# Patient Record
Sex: Female | Born: 1970
Health system: Southern US, Community
[De-identification: ages and names within clinical notes are randomized; demographics above are authoritative.]

## PROBLEM LIST (undated history)

## (undated) DIAGNOSIS — D242 Benign neoplasm of left breast: Secondary | ICD-10-CM

## (undated) DIAGNOSIS — N941 Unspecified dyspareunia: Secondary | ICD-10-CM

## (undated) DIAGNOSIS — Z973 Presence of spectacles and contact lenses: Secondary | ICD-10-CM

## (undated) DIAGNOSIS — D509 Iron deficiency anemia, unspecified: Secondary | ICD-10-CM

## (undated) DIAGNOSIS — D649 Anemia, unspecified: Secondary | ICD-10-CM

## (undated) DIAGNOSIS — D259 Leiomyoma of uterus, unspecified: Secondary | ICD-10-CM

## (undated) DIAGNOSIS — Z972 Presence of dental prosthetic device (complete) (partial): Secondary | ICD-10-CM

## (undated) DIAGNOSIS — H1849 Other corneal degeneration: Secondary | ICD-10-CM

## (undated) DIAGNOSIS — N950 Postmenopausal bleeding: Secondary | ICD-10-CM

## (undated) HISTORY — PX: CORNEAL TRANSPLANT: SHX108

---

## 1993-11-24 HISTORY — PX: CORNEAL TRANSPLANT: SHX108

## 1996-11-24 HISTORY — PX: APPENDECTOMY: SHX54

## 1998-11-24 HISTORY — PX: PENETRATING KERATOPLASTY: SHX2197

## 2000-10-19 ENCOUNTER — Other Ambulatory Visit: Admission: RE | Admit: 2000-10-19 | Discharge: 2000-10-19 | Payer: Self-pay | Admitting: Obstetrics & Gynecology

## 2001-12-16 ENCOUNTER — Other Ambulatory Visit: Admission: RE | Admit: 2001-12-16 | Discharge: 2001-12-16 | Payer: Self-pay | Admitting: Obstetrics & Gynecology

## 2002-08-29 ENCOUNTER — Inpatient Hospital Stay (HOSPITAL_COMMUNITY): Admission: AD | Admit: 2002-08-29 | Discharge: 2002-08-29 | Payer: Self-pay | Admitting: Obstetrics and Gynecology

## 2002-10-19 ENCOUNTER — Inpatient Hospital Stay (HOSPITAL_COMMUNITY): Admission: AD | Admit: 2002-10-19 | Discharge: 2002-10-22 | Payer: Self-pay | Admitting: Obstetrics & Gynecology

## 2002-11-29 ENCOUNTER — Other Ambulatory Visit: Admission: RE | Admit: 2002-11-29 | Discharge: 2002-11-29 | Payer: Self-pay | Admitting: Obstetrics & Gynecology

## 2004-06-17 ENCOUNTER — Emergency Department (HOSPITAL_COMMUNITY): Admission: EM | Admit: 2004-06-17 | Discharge: 2004-06-17 | Payer: Self-pay | Admitting: Emergency Medicine

## 2005-05-29 ENCOUNTER — Ambulatory Visit (HOSPITAL_COMMUNITY): Admission: RE | Admit: 2005-05-29 | Discharge: 2005-05-29 | Payer: Self-pay | Admitting: Gastroenterology

## 2006-03-25 ENCOUNTER — Other Ambulatory Visit: Admission: RE | Admit: 2006-03-25 | Discharge: 2006-03-25 | Payer: Self-pay | Admitting: *Deleted

## 2006-12-27 ENCOUNTER — Emergency Department (HOSPITAL_COMMUNITY): Admission: EM | Admit: 2006-12-27 | Discharge: 2006-12-27 | Payer: Self-pay | Admitting: Emergency Medicine

## 2007-03-31 ENCOUNTER — Other Ambulatory Visit: Admission: RE | Admit: 2007-03-31 | Discharge: 2007-03-31 | Payer: Self-pay | Admitting: *Deleted

## 2007-08-24 ENCOUNTER — Ambulatory Visit: Payer: Self-pay | Admitting: Internal Medicine

## 2007-08-25 DIAGNOSIS — D573 Sickle-cell trait: Secondary | ICD-10-CM

## 2007-08-25 HISTORY — DX: Sickle-cell trait: D57.3

## 2007-08-26 LAB — CBC WITH DIFFERENTIAL/PLATELET
Eosinophils Absolute: 0.2 10*3/uL (ref 0.0–0.5)
NEUT%: 53.7 % (ref 39.6–76.8)
Platelets: 259 10*3/uL (ref 145–400)
RBC: 3.67 10*6/uL — ABNORMAL LOW (ref 3.70–5.32)
RDW: 16.8 % — ABNORMAL HIGH (ref 11.3–14.5)
WBC: 4.2 10*3/uL (ref 3.9–10.0)
lymph#: 1.4 10*3/uL (ref 0.9–3.3)

## 2007-08-27 LAB — COMPREHENSIVE METABOLIC PANEL
BUN: 10 mg/dL (ref 6–23)
CO2: 26 mEq/L (ref 19–32)
Calcium: 9 mg/dL (ref 8.4–10.5)
Chloride: 107 mEq/L (ref 96–112)
Glucose, Bld: 77 mg/dL (ref 70–99)
Sodium: 140 mEq/L (ref 135–145)
Total Bilirubin: 0.6 mg/dL (ref 0.3–1.2)

## 2007-08-27 LAB — IRON AND TIBC
Iron: 60 ug/dL (ref 42–145)
UIBC: 249 ug/dL

## 2007-08-27 LAB — FERRITIN: Ferritin: 13 ng/mL (ref 10–291)

## 2007-08-27 LAB — FOLATE RBC: RBC Folate: 433 ng/mL (ref 180–600)

## 2007-08-30 LAB — HEMOGLOBINOPATHY EVALUATION
Hemoglobin Other: 0 % (ref 0.0–0.0)
Hgb A2 Quant: 3.3 % — ABNORMAL HIGH (ref 2.2–3.2)
Hgb A: 54.8 % — ABNORMAL LOW (ref 96.8–97.8)
Hgb F Quant: 0.8 % (ref 0.0–2.0)
Hgb S Quant: 41.1 % — ABNORMAL HIGH (ref 0.0–0.0)

## 2007-10-14 ENCOUNTER — Ambulatory Visit: Payer: Self-pay | Admitting: Internal Medicine

## 2007-10-18 LAB — IRON AND TIBC
TIBC: 307 ug/dL (ref 250–470)
UIBC: 179 ug/dL

## 2007-10-18 LAB — CBC WITH DIFFERENTIAL/PLATELET
Basophils Absolute: 0 10*3/uL (ref 0.0–0.1)
EOS%: 3.4 % (ref 0.0–7.0)
Eosinophils Absolute: 0.2 10*3/uL (ref 0.0–0.5)
HCT: 31.2 % — ABNORMAL LOW (ref 34.8–46.6)
HGB: 10.7 g/dL — ABNORMAL LOW (ref 11.6–15.9)
LYMPH%: 32.8 % (ref 14.0–48.0)
MONO%: 9.4 % (ref 0.0–13.0)
NEUT%: 53.7 % (ref 39.6–76.8)
Platelets: 289 10*3/uL (ref 145–400)

## 2007-10-18 LAB — FERRITIN: Ferritin: 19 ng/mL (ref 10–291)

## 2008-01-13 ENCOUNTER — Ambulatory Visit: Payer: Self-pay | Admitting: Internal Medicine

## 2008-01-18 LAB — CBC WITH DIFFERENTIAL/PLATELET
BASO%: 0.4 % (ref 0.0–2.0)
Basophils Absolute: 0 10*3/uL (ref 0.0–0.1)
EOS%: 5.7 % (ref 0.0–7.0)
Eosinophils Absolute: 0.3 10*3/uL (ref 0.0–0.5)
HGB: 11 g/dL — ABNORMAL LOW (ref 11.6–15.9)
MCV: 84.3 fL (ref 81.0–101.0)
MONO#: 0.5 10*3/uL (ref 0.1–0.9)
MONO%: 9 % (ref 0.0–13.0)
RBC: 3.86 10*6/uL (ref 3.70–5.32)

## 2008-01-18 LAB — IRON AND TIBC
Iron: 35 ug/dL — ABNORMAL LOW (ref 42–145)
UIBC: 284 ug/dL

## 2008-04-20 ENCOUNTER — Ambulatory Visit: Payer: Self-pay | Admitting: Internal Medicine

## 2008-04-25 LAB — CBC WITH DIFFERENTIAL/PLATELET
EOS%: 3 % (ref 0.0–7.0)
Eosinophils Absolute: 0.2 10*3/uL (ref 0.0–0.5)
HCT: 31.6 % — ABNORMAL LOW (ref 34.8–46.6)
HGB: 10.6 g/dL — ABNORMAL LOW (ref 11.6–15.9)
LYMPH%: 33.4 % (ref 14.0–48.0)
MCHC: 33.6 g/dL (ref 32.0–36.0)
MCV: 84.3 fL (ref 81.0–101.0)
MONO%: 7.6 % (ref 0.0–13.0)
NEUT#: 2.9 10*3/uL (ref 1.5–6.5)
NEUT%: 55.3 % (ref 39.6–76.8)
Platelets: 256 10*3/uL (ref 145–400)
WBC: 5.2 10*3/uL (ref 3.9–10.0)

## 2008-04-25 LAB — FERRITIN: Ferritin: 19 ng/mL (ref 10–291)

## 2008-04-25 LAB — IRON AND TIBC: TIBC: 313 ug/dL (ref 250–470)

## 2009-02-13 ENCOUNTER — Other Ambulatory Visit: Admission: RE | Admit: 2009-02-13 | Discharge: 2009-02-13 | Payer: Self-pay | Admitting: Family Medicine

## 2010-02-14 ENCOUNTER — Other Ambulatory Visit: Admission: RE | Admit: 2010-02-14 | Discharge: 2010-02-14 | Payer: Self-pay | Admitting: Family Medicine

## 2010-12-10 ENCOUNTER — Encounter
Admission: RE | Admit: 2010-12-10 | Discharge: 2010-12-10 | Payer: Self-pay | Source: Home / Self Care | Attending: Family Medicine | Admitting: Family Medicine

## 2010-12-17 ENCOUNTER — Encounter
Admission: RE | Admit: 2010-12-17 | Discharge: 2010-12-17 | Payer: Self-pay | Source: Home / Self Care | Attending: Family Medicine | Admitting: Family Medicine

## 2011-04-11 NOTE — Discharge Summary (Signed)
   NAME:  Angie Carroll, Angie Carroll                         ACCOUNT NO.:  [id number]   MEDICAL RECORD NO.:  [id number]                   PATIENT TYPE:  INP   LOCATION:  9135                                 FACILITY:  [facility]   PHYSICIAN:  Leilani Able, P.A.-C.          DATE OF BIRTH:  04/21/1971   DATE OF ADMISSION:  10/19/2002  DATE OF DISCHARGE:  10/22/2002                                 DISCHARGE SUMMARY   FINAL DIAGNOSES:  1. Intrauterine pregnancy at 38 plus weeks gestation, early active  labor,     history of previous cesarean section, desire for repeat cesarean section.  2. Pedunculated myoma.   PROCEDURE:  Repeat low transverse cesarean section, by Gerrit Friends. Aldona Bar, M.D.  No complications.   HISTORY OF PRESENT ILLNESS:  This 40 year old G3, P1, presents at 54 plus  weeks gestation in active labor. The patient had a positive group B  Streptococcus cultures which was performed in the office and she had a  questionable rupture of membranes for two hours before presentation. The  patient's antepartum course had been complicated by positive group B  Streptococcus culture, a sickle cell trait although the father of the baby  was negative, history of a previous cesarean section and history of an  ectopic pregnancy.   Upon admission the patient expressed her desire for a repeat cesarean  section. She was taken to the operating room on October 19, 2002, by Dr.  Annamaria Helling, where repeat low transverse cesarean section as performed with  the delivery of a 7 pound, 5 ounce female infant with Apgars of 8 and 9.  There was also a pedunculated fundal myoma that was found in the left  fundus. This was left alone at that time.   The procedure went without complications and the patient's postoperative  course was benign without significant fevers. The patient was thought ready  for discharge on postoperative day #3.   DISCHARGE INSTRUCTIONS:  She was sent home on a regular diet.  She was told  to decrease her activity.   DISCHARGE MEDICATIONS:  1. She was told to continue prenatal vitamins.  2. She was given a prescription for Percocet 1 to  2 tablets q.4h. p.r.n.     pain.  3. She was told she could  use ibuprofen 600 mg 1 q.6h.  p.r.n. pain.   FOLLOW UP:  She was told to follow up in the office in two weeks.                                               Leilani Able, P.A.-C.    MB/MEDQ  D:  11/21/2002  T:  11/21/2002  Job:  [id number]

## 2011-04-11 NOTE — Op Note (Signed)
NAME:  Angie Carroll, Angie Carroll                         ACCOUNT NO.:  [id number]   MEDICAL RECORD NO.:  [id number]                   PATIENT TYPE:  INP   LOCATION:  9135                                 FACILITY:  [facility]   PHYSICIAN:  Gerrit Friends. Aldona Bar, M.D.                DATE OF BIRTH:  February 03, 1971   DATE OF PROCEDURE:  10/19/2002  DATE OF DISCHARGE:                                 OPERATIVE REPORT   PREOPERATIVE DIAGNOSIS:  A 38 to 39-week intrauterine pregnancy, early  active labor.  Previous cesarean section.  Desire for repeat cesarean  section.   POSTOPERATIVE DIAGNOSIS:  A 38 to 39-week intrauterine pregnancy, early  active labor.  Previous cesarean section.  Desire for repeat cesarean  section.  Delivery of 7-pound, 5-ounce female infant, Apgars 8 and 9 and  pedunculated myoma, left fundal portion of uterus--4 to 5 cm in diameter and  status post removal of left tube and ovary.   OPERATION PERFORMED:  Repeat low transverse cesarean section.   SURGEON:  Gerrit Friends. Aldona Bar, M.D.   ANESTHESIA:  Subarachnoid block, Burnett Corrente, M.D.   INDICATIONS FOR PROCEDURE:  This gravida 3, para 1, previous cesarean  section related probable spontaneous rupture of membranes associated with  the onset of labor at around 11 p.m. on the evening of November 25.  She was  scheduled for elective repeat cesarean section on the morning of December 1.  After she arrived in triage and was examined, she was found to have changed  her cervix and there was questionable amniotomy as well.  She was taken to  the operating room for repeat cesarean section.   DESCRIPTION OF PROCEDURE:  Once in the operating room, subarachnoid block  was carried out by Dr. Harvest Forest without difficulty.  Thereafter, the patient  was prepped and draped in the usual fashion with a Foley catheter inserted  as part of the prep.   Once the anesthetic levels were documented, the Pfannenstiel incision was  made dissecting down sharply to and  through the fascia in a low transverse  fashion with hemostasis created each layer.  Subfascial space was created  inferiorly and superiorly.  Muscles separated in the midline, peritoneum  identified and entered appropriately with care taken to avoid the bowel  superiorly and the bladder inferiorly.  At this time the vesicouterine  peritoneum was incised in low transverse fashion, pushed off the lower  segment with ease.  Sharp incision with the Metzenbaum scissors was then  employed in the lower segment.  The lower segment was noted to be somewhat  thin.  The incision was extended laterally with the fingers.  There was  production of amniotic fluid which was very pale, tinged green.  With  minimal difficulty thereafter, delivery of a viable female infant which  cried spontaneously at once was carried out from a vertex position.  Once  the infant was delivered  and the cord was clamped and cut, the infant was  passed off to the awaiting team.  Subsequent Apgars noted to be 8 and 9,  weight was found to be 7 pounds, 5 ounces.  The baby was taken subsequently  to the nursery in good condition.   After the cord bloods were collected, placenta was delivered intact.  There  were some filmy adhesions over the anterior surface of the uterus.  It was  possible to explore the uterus entirely, however, but the uterus was not  exteriorized.   There was a pedunculated fundal myoma emanating off the left portion of the  fundus of the uterus measuring approximately 4 to  5 cm in diameter.  The fundus of the uterus also felt somewhat globular.   Good uterine contractility was afforded with slowly given intravenous  Pitocin and manual stimulation.  Assured that the uterus was evacuated of  all products of conception, the uterine incision was then reapproximated  using #1 Vicryl from angle to midline bilaterally.  Several figure-of-eight  #1 Vicryls were placed for additional hemostasis.  At this time  the right  tube and ovary was inspected and noted to be normal.  The left tube and  ovary had been previously removed.   The uterine incision was dry.  At this time, all counts were noted to be  correct and no foreign bodies were noted to be remaining in the abdominal  cavity.  Irrigation of the abdomen was carried out removing all blood and  clot.  At this time good uterine incision hemostasis noted.  The abdominal  cavity was closed in layers.  The abdominal peritoneum was closed with 0  Vicryl in a running fashion and muscle secured with the same. Assured the  fascia had good hemostasis, the fascia was reapproximated using 0 Vicryl  from angle to midline bilaterally. Assured the subcu had good hemostasis,  the skin was then closed with staples and a sterile pressure dressing  applied.   Thereafter the patient was taken to the recovery room in satisfactory  condition having tolerated the procedure well.  All counts correct times  two.  The estimated blood loss was 500 cc.   At the conclusion of the procedure, both mother and baby were doing well in  their respective recovery areas.                                                 Gerrit Friends. Aldona Bar, M.D.    RMW/MEDQ  D:  10/19/2002  T:  10/19/2002  Job:  [id number]

## 2011-04-11 NOTE — Op Note (Signed)
[name], [name] NO.:  [id number]   MEDICAL RECORD NO.:  [id number]          PATIENT TYPE:  AMB   LOCATION:  ENDO                         FACILITY:  [facility]   PHYSICIAN:  Petra Kuba, M.D.    DATE OF BIRTH:  1970-12-29   DATE OF PROCEDURE:  05/29/2005  DATE OF DISCHARGE:                                 OPERATIVE REPORT   PROCEDURE:  Colonoscopy.   ENDOSCOPIST:  Petra Kuba, M.D.   INDICATION:  Bright red blood per rectum, abnormal flexible sigmoidoscopy,  anemia, abdominal pain. Consent was signed after risks, benefits, methods,  options were thoroughly discussed in the office with the patient and her  husband.   MEDICINES USED:  Demerol 90, Versed 9.   DESCRIPTION OF PROCEDURE:  Rectal inspection is pertinent fir small external  hemorrhoids. Digital exam was negative. Video pediatric adjustable  colonoscope was inserted and with lots of difficulty, due to a very tortuous  curvy, long looping colon, required rolling her on her back, then her right  side, then back to her back, then back to her left side and multiple  abdominal pressures and multiple advancements and withdrawals, we were  finally able to advance into the cecum. No abnormalities were seen on  insertion. We did advance the scope a short ways into the terminal ileum  which was normal.  Photo documentation was obtained. The appendiceal orifice  and the ileocecal valve were obviously seen. The prep was adequate. There  was some liquid stool that required suctioning only.  On slow withdrawal  through the colon, no abnormalities were seen;  however, when we did fall  back around the loop, we did not try to readvance around the curves since it  took so long to advance.  Probably excellent visualization was seen on  insertion. However, on withdrawal back to the rectum, no abnormalities were  seen.  Specifically, in the left side, there were no signs of colitis.  Anorectal pull-through on  retroflexion revealed some small hemorrhoids. The  scope was straightened and readvanced a short ways up the left side of the  colon.  Air was suctioned and the scope removed. The patient tolerated the  procedure well. There was no obvious immediate complication.   ENDOSCOPIC DIAGNOSIS:  1.  Internal and external hemorrhoids.  2.  Long looping tortuous colon.  3.  Otherwise within normal limits to the cecum and the end of the terminal      ileum.   PLAN:  Happy to see back p.r.n. or in two months. Consider CT next for pain  versus gynecologic workup and return care to Dr. Janey Greaser.       MEM/MEDQ  D:  05/29/2005  T:  05/29/2005  Job:  [id number]   cc:   Al Decant. Janey Greaser, MD  [address]  [place]  [place] [address]  Fax: [phone]

## 2012-02-27 ENCOUNTER — Other Ambulatory Visit: Payer: Self-pay | Admitting: Obstetrics and Gynecology
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
## 2012-02-28 ENCOUNTER — Ambulatory Visit (HOSPITAL_COMMUNITY)
Admission: AD | Admit: 2012-02-28 | Discharge: 2012-02-28 | Disposition: A | Discharge status: Home / Self Care | Payer: 59 | Source: Ambulatory Visit | Attending: Obstetrics and Gynecology | Admitting: Obstetrics and Gynecology

## 2012-02-28 ENCOUNTER — Encounter (HOSPITAL_COMMUNITY): Payer: Self-pay

## 2012-02-28 ENCOUNTER — Encounter (HOSPITAL_COMMUNITY): Payer: Self-pay | Admitting: *Deleted

## 2012-02-28 ENCOUNTER — Inpatient Hospital Stay (HOSPITAL_COMMUNITY): Admission: AD | Admit: 2012-02-28 | Payer: Self-pay | Admitting: Obstetrics & Gynecology

## 2012-02-28 DIAGNOSIS — D649 Anemia, unspecified: Secondary | ICD-10-CM

## 2012-02-28 DIAGNOSIS — D259 Leiomyoma of uterus, unspecified: Secondary | ICD-10-CM | POA: Insufficient documentation

## 2012-02-28 DIAGNOSIS — D500 Iron deficiency anemia secondary to blood loss (chronic): Secondary | ICD-10-CM | POA: Insufficient documentation

## 2012-02-28 DIAGNOSIS — N920 Excessive and frequent menstruation with regular cycle: Secondary | ICD-10-CM | POA: Insufficient documentation

## 2012-02-28 LAB — PREPARE RBC (CROSSMATCH)

## 2012-02-28 LAB — CBC
HCT: 20.4 % — ABNORMAL LOW (ref 36.0–46.0)
Hemoglobin: 5.9 g/dL — CL (ref 12.0–15.0)
MCH: 18.5 pg — ABNORMAL LOW (ref 26.0–34.0)
MCV: 63.9 fL — ABNORMAL LOW (ref 78.0–100.0)
RBC: 3.19 MIL/uL — ABNORMAL LOW (ref 3.87–5.11)

## 2012-02-28 MED ORDER — NORETHINDRONE ACETATE 5 MG PO TABS: 5.0000 mg | ORAL_TABLET | Freq: Every day | ORAL | Status: DC

## 2012-02-28 MED ORDER — LIDOCAINE HCL (PF) 1 % IJ SOLN
INTRAMUSCULAR | Status: AC
  Filled 2012-02-28: qty 30

## 2012-02-28 MED ORDER — DIPHENHYDRAMINE HCL 25 MG PO CAPS
25.0000 mg | ORAL_CAPSULE | Freq: Once | ORAL | Status: AC
  Administered 2012-02-28: 25 mg via ORAL
  Filled 2012-02-28: qty 1

## 2012-02-28 NOTE — Progress Notes (Signed)
Patient ID: Angie Carroll, female   DOB: 1971/07/08, 41 y.o.   MRN: [id number]  Admission for Blood Transfusion  Pt reports her fatigue has worsened over the last day or so.  Currently she is comfortable at rest. VS wnl Gen: NAD. CV:RRR Lungs: CTA bilaterally Hemoglobin 5.9  A/p Symptomatic Anemia due menorrhagia, uterine fibroids. Proceed with blood transfusion of 2 units minimum.  Consider an additional unit b/c pt is s/p Depo Lupron and may have more bleeding. Risks previously reviewed with pt.  Sign consent.

## 2012-02-28 NOTE — Discharge Instructions (Signed)
Blood Transfusion Information  WHAT IS A BLOOD TRANSFUSION?  A transfusion is the replacement of blood or some of its parts. Blood is made up of multiple cells which provide different functions.   Red blood cells carry oxygen and are used for blood loss replacement.   White blood cells fight against infection.   Platelets control bleeding.   Plasma helps clot blood.   Other blood products are available for specialized needs, such as hemophilia or other clotting disorders.  BEFORE THE TRANSFUSION   Who gives blood for transfusions?    You may be able to donate blood to be used at a later date on yourself (autologous donation).   Relatives can be asked to donate blood. This is generally not any safer than if you have received blood from a stranger. The same precautions are taken to ensure safety when a relative's blood is donated.   Healthy volunteers who are fully evaluated to make sure their blood is safe. This is blood bank blood.  Transfusion therapy is the safest it has ever been in the practice of medicine. Before blood is taken from a donor, a complete history is taken to make sure that person has no history of diseases nor engages in risky social behavior (examples are intravenous drug use or sexual activity with multiple partners). The donor's travel history is screened to minimize risk of transmitting infections, such as malaria. The donated blood is tested for signs of infectious diseases, such as HIV and hepatitis. The blood is then tested to be sure it is compatible with you in order to minimize the chance of a transfusion reaction. If you or a relative donates blood, this is often done in anticipation of surgery and is not appropriate for emergency situations. It takes many days to process the donated blood.  RISKS AND COMPLICATIONS  Although transfusion therapy is very safe and saves many lives, the main dangers of transfusion include:    Getting an infectious disease.   Developing a  transfusion reaction. This is an allergic reaction to something in the blood you were given. Every precaution is taken to prevent this.  The decision to have a blood transfusion has been considered carefully by your caregiver before blood is given. Blood is not given unless the benefits outweigh the risks.  AFTER THE TRANSFUSION   Right after receiving a blood transfusion, you will usually feel much better and more energetic. This is especially true if your red blood cells have gotten low (anemic). The transfusion raises the level of the red blood cells which carry oxygen, and this usually causes an energy increase.   The nurse administering the transfusion will monitor you carefully for complications.  HOME CARE INSTRUCTIONS   No special instructions are needed after a transfusion. You may find your energy is better. Speak with your caregiver about any limitations on activity for underlying diseases you may have.  SEEK MEDICAL CARE IF:    Your condition is not improving after your transfusion.   You develop redness or irritation at the intravenous (IV) site.  SEEK IMMEDIATE MEDICAL CARE IF:   Any of the following symptoms occur over the next 12 hours:   Shaking chills.   You have a temperature by mouth above 102 F (38.9 C), not controlled by medicine.   Chest, back, or muscle pain.   People around you feel you are not acting correctly or are confused.   Shortness of breath or difficulty breathing.   Dizziness and fainting.  You get a rash or develop hives.   You have a decrease in urine output.   Your urine turns a dark color or changes to pink, red, or brown.  Any of the following symptoms occur over the next 10 days:   You have a temperature by mouth above 102 F (38.9 C), not controlled by medicine.   Shortness of breath.   Weakness after normal activity.   The white part of the eye turns yellow (jaundice).   You have a decrease in the amount of urine or are urinating less often.   Your  urine turns a dark color or changes to pink, red, or brown.  Document Released: 11/07/2000 Document Revised: 10/30/2011 Document Reviewed: 06/26/2008  [facility] Patient Information 2012 [place], [place].

## 2012-02-28 NOTE — Progress Notes (Signed)
Discharge instructions reviewed with pt.  No home equipment needed.  Discharged home with family.  Ambulated to car with staff without incident.

## 2012-02-28 NOTE — Progress Notes (Signed)
Patient ID: Angie Carroll, female   DOB: [birth year], 41 y.o.   MRN: [id number] Pt has tolerated transfusion well.  Had some intermittent chest tightness with deep inspiration but that has resolved. VS stable. Lungs: CTA bilaterally. A/p s/p Blood transfusion s/p 2 units. Discharge home. Continue iron. Rx for Aygestin for add back therapy with Depo Lupron.

## 2012-02-28 NOTE — Discharge Summary (Signed)
Physician Discharge Summary  Patient ID: Angie Carroll MRN: [id number] DOB/AGE: 03-06-1971 40 y.o.  Admit date: 02/28/2012 Discharge date: 02/28/2012  Admission Diagnoses:Severe anemia, symptomatic, Menorrhagia, uterine fibroids  Discharge Diagnoses: Same Active Problems:  * No active hospital problems. *    Discharged Condition: good  Hospital Course: Received 2 units PRBCs without complications.  Premedicated with Tylenol and Benadryl.  Starting hemoglobin 5.9.  Consults: None  Significant Diagnostic Studies: None  Treatments: Blood transfusion  Discharge Exam: Blood pressure 108/74, pulse 63, temperature 98.1 F (36.7 C), temperature source Oral, resp. rate 18, height 5\' 5"  (1.651 m), weight 55.792 kg (123 lb), SpO2 99.00%. WNL at discharge  Disposition:   Discharge Orders    Future Orders Please Complete By Expires   Diet - low sodium heart healthy      Increase activity slowly      Discharge instructions      Comments:   See discharge instructions.   Call MD for:  temperature >100.4      Call MD for:  difficulty breathing, headache or visual disturbances      Call MD for:  hives        Medication List  As of 02/28/2012  5:58 PM   TAKE these medications         ferrous sulfate 325 (65 FE) MG tablet   Take 325 mg by mouth 2 (two) times daily.      norethindrone 5 MG tablet   Commonly known as: AYGESTIN   Take 1 tablet (5 mg total) by mouth daily.           Follow-up Information    Follow up with Geryl Rankins, MD in 1 week. (Blood work, )    Solicitor information:   [address] [place], [place]. [address] [place] [address] [phone]       Follow up with Geryl Rankins, MD in 4 weeks. (office visit)    Contact information:   [address] [place], [place]. [address] [place] [address] [phone]          Signed: Geryl Rankins 02/28/2012, 5:58 PM

## 2012-02-29 LAB — TYPE AND SCREEN: Unit division: 0

## 2012-11-24 HISTORY — PX: BREAST EXCISIONAL BIOPSY: SUR124

## 2012-12-08 ENCOUNTER — Ambulatory Visit: Payer: Self-pay | Admitting: Family Medicine

## 2012-12-09 ENCOUNTER — Ambulatory Visit: Payer: Self-pay | Admitting: Family Medicine

## 2012-12-23 ENCOUNTER — Ambulatory Visit: Payer: Self-pay | Admitting: Surgery

## 2012-12-31 ENCOUNTER — Ambulatory Visit: Payer: Self-pay | Admitting: Surgery

## 2012-12-31 HISTORY — PX: BREAST EXCISIONAL BIOPSY: SUR124

## 2013-06-07 ENCOUNTER — Emergency Department: Payer: Self-pay | Admitting: Emergency Medicine

## 2013-06-07 LAB — COMPREHENSIVE METABOLIC PANEL
Alkaline Phosphatase: 131 U/L (ref 50–136)
Creatinine: 0.73 mg/dL (ref 0.60–1.30)
EGFR (African American): >60
EGFR (Non-African Amer.): >60
Glucose: 150 mg/dL — ABNORMAL HIGH (ref 65–99)
Osmolality: 281 (ref 275–301)
SGOT(AST): 68 U/L — ABNORMAL HIGH (ref 15–37)
SGPT (ALT): 113 U/L — ABNORMAL HIGH (ref 12–78)

## 2013-06-07 LAB — URINALYSIS, COMPLETE
Glucose,UR: NEGATIVE mg/dL (ref 0–75)
Ketone: NEGATIVE
Protein: NEGATIVE
RBC,UR: <1 /HPF (ref 0–5)
Specific Gravity: 1.012 (ref 1.003–1.030)
WBC UR: 1 /HPF (ref 0–5)

## 2013-06-07 LAB — CBC
MCHC: 31.3 g/dL — ABNORMAL LOW (ref 32.0–36.0)
Platelet: 338 10*3/uL (ref 150–440)

## 2013-07-07 ENCOUNTER — Other Ambulatory Visit: Payer: Self-pay | Admitting: Physician Assistant

## 2013-07-07 ENCOUNTER — Other Ambulatory Visit (HOSPITAL_COMMUNITY)
Admission: RE | Admit: 2013-07-07 | Discharge: 2013-07-07 | Disposition: A | Discharge status: Home / Self Care | Payer: 59 | Source: Ambulatory Visit | Attending: Family Medicine | Admitting: Family Medicine

## 2013-07-07 DIAGNOSIS — Z01419 Encounter for gynecological examination (general) (routine) without abnormal findings: Secondary | ICD-10-CM | POA: Insufficient documentation

## 2015-02-26 ENCOUNTER — Ambulatory Visit: Admit: 2015-02-26 | Disposition: A | Discharge status: Home / Self Care | Payer: Self-pay

## 2015-03-09 ENCOUNTER — Other Ambulatory Visit (HOSPITAL_COMMUNITY)
Admission: RE | Admit: 2015-03-09 | Discharge: 2015-03-09 | Disposition: A | Discharge status: Home / Self Care | Payer: 59 | Source: Ambulatory Visit | Attending: Obstetrics & Gynecology | Admitting: Obstetrics & Gynecology

## 2015-03-09 ENCOUNTER — Other Ambulatory Visit: Payer: Self-pay | Admitting: Obstetrics & Gynecology

## 2015-03-09 DIAGNOSIS — Z1151 Encounter for screening for human papillomavirus (HPV): Secondary | ICD-10-CM | POA: Diagnosis present

## 2015-03-09 DIAGNOSIS — Z01419 Encounter for gynecological examination (general) (routine) without abnormal findings: Secondary | ICD-10-CM | POA: Diagnosis not present

## 2015-03-12 LAB — CYTOLOGY - PAP

## 2015-03-16 ENCOUNTER — Other Ambulatory Visit: Payer: Self-pay | Admitting: Obstetrics & Gynecology

## 2015-03-16 NOTE — Op Note (Signed)
PATIENT NAME:  Angie Carroll, Angie Carroll MR#:  [id number] DATE OF BIRTH:  04-03-1971  DATE OF PROCEDURE:  12/31/2012  PREOPERATIVE DIAGNOSIS: Right breast mass.   POSTOPERATIVE DIAGNOSIS: Right breast mass.   PROCEDURE: Excision of right breast mass.   SURGEON: Rochel Brome, MD  ANESTHESIA: General.   INDICATION: This 44 year old female recently had CT which demonstrated a mass in the right breast, also had mammogram depicting a mass and had physical findings of a very tender mass in the upper outer quadrant, retroareolar portion, of the right breast, and excision was recommended for definitive treatment. She was extremely tender and did not appear that she would tolerate needle biopsy under local anesthesia, but we suspected a fibroadenoma and elected to excise it.  DESCRIPTION OF PROCEDURE: The patient was placed on the operating table in the supine position under general anesthesia. The right breast was prepared with ChloraPrep and draped in a sterile manner. The mass was palpable in the retroareolar portion, upper outer quadrant. A periareolar incision was made from approximately 9 o'clock  to 12 o'clock  position and carried down through subcutaneous tissues. Electrocautery was used for hemostasis and dissected down to encounter a white, firm, moderately lobulated mass which was excised. It did have a smooth plane of dissection. It was completely excised. The ex vivo measurement was some 4 cm. It was submitted fresh for routine pathology. The wound was inspected, several small bleeding points were cauterized, the tissues were infiltrated with 0.5% Sensorcaine with epinephrine, the subcutaneous tissues were approximated with 5-0 Monocryl and then the skin was closed with         running 5-0 Monocryl subcuticular suture and Dermabond. The patient tolerated surgery satisfactorily and was then prepared for transfer to the recovery room. ____________________________ Lenna Sciara. Rochel Brome,  MD jws:sb D: 12/31/2012 10:15:02 ET T: 12/31/2012 10:48:58 ET JOB#: [id number]  cc: Loreli Dollar, MD, <Dictator> Loreli Dollar MD ELECTRONICALLY SIGNED 01/08/2013 20:38

## 2015-03-21 ENCOUNTER — Ambulatory Visit: Admit: 2015-03-21 | Disposition: A | Discharge status: Home / Self Care | Payer: Self-pay

## 2015-11-01 ENCOUNTER — Other Ambulatory Visit: Payer: Self-pay | Admitting: Physician Assistant

## 2015-11-01 DIAGNOSIS — N63 Unspecified lump in unspecified breast: Secondary | ICD-10-CM

## 2015-11-22 ENCOUNTER — Other Ambulatory Visit: Payer: Self-pay | Admitting: Physician Assistant

## 2015-11-22 ENCOUNTER — Ambulatory Visit
Admission: RE | Admit: 2015-11-22 | Discharge: 2015-11-22 | Disposition: A | Discharge status: Home / Self Care | Payer: 59 | Source: Ambulatory Visit | Attending: Physician Assistant | Admitting: Physician Assistant

## 2015-11-22 DIAGNOSIS — N63 Unspecified lump in unspecified breast: Secondary | ICD-10-CM

## 2016-06-10 ENCOUNTER — Other Ambulatory Visit: Payer: Self-pay | Admitting: Physician Assistant

## 2016-06-10 DIAGNOSIS — N6310 Unspecified lump in the right breast, unspecified quadrant: Secondary | ICD-10-CM

## 2016-07-04 ENCOUNTER — Encounter: Payer: Self-pay | Admitting: Radiology

## 2016-07-04 ENCOUNTER — Ambulatory Visit
Admission: RE | Admit: 2016-07-04 | Discharge: 2016-07-04 | Disposition: A | Discharge status: Home / Self Care | Payer: 59 | Source: Ambulatory Visit | Attending: Physician Assistant | Admitting: Physician Assistant

## 2016-07-04 DIAGNOSIS — N6310 Unspecified lump in the right breast, unspecified quadrant: Secondary | ICD-10-CM

## 2016-07-04 DIAGNOSIS — Z1231 Encounter for screening mammogram for malignant neoplasm of breast: Secondary | ICD-10-CM | POA: Insufficient documentation

## 2016-07-04 DIAGNOSIS — N63 Unspecified lump in breast: Secondary | ICD-10-CM | POA: Diagnosis not present

## 2016-12-21 DIAGNOSIS — R300 Dysuria: Secondary | ICD-10-CM | POA: Diagnosis not present

## 2017-01-15 DIAGNOSIS — R300 Dysuria: Secondary | ICD-10-CM | POA: Diagnosis not present

## 2017-01-15 DIAGNOSIS — N390 Urinary tract infection, site not specified: Secondary | ICD-10-CM | POA: Diagnosis not present

## 2017-02-27 DIAGNOSIS — Z0000 Encounter for general adult medical examination without abnormal findings: Secondary | ICD-10-CM | POA: Diagnosis not present

## 2017-02-27 DIAGNOSIS — Z13220 Encounter for screening for lipoid disorders: Secondary | ICD-10-CM | POA: Diagnosis not present

## 2017-03-13 DIAGNOSIS — N959 Unspecified menopausal and perimenopausal disorder: Secondary | ICD-10-CM | POA: Diagnosis not present

## 2017-03-13 DIAGNOSIS — R300 Dysuria: Secondary | ICD-10-CM | POA: Diagnosis not present

## 2017-03-18 ENCOUNTER — Other Ambulatory Visit: Payer: Self-pay | Admitting: Obstetrics & Gynecology

## 2017-03-18 DIAGNOSIS — N6459 Other signs and symptoms in breast: Secondary | ICD-10-CM

## 2017-04-03 ENCOUNTER — Ambulatory Visit
Admission: RE | Admit: 2017-04-03 | Discharge: 2017-04-03 | Disposition: A | Discharge status: Home / Self Care | Payer: 59 | Source: Ambulatory Visit | Attending: Obstetrics & Gynecology | Admitting: Obstetrics & Gynecology

## 2017-04-03 DIAGNOSIS — R922 Inconclusive mammogram: Secondary | ICD-10-CM | POA: Diagnosis not present

## 2017-04-03 DIAGNOSIS — R928 Other abnormal and inconclusive findings on diagnostic imaging of breast: Secondary | ICD-10-CM | POA: Insufficient documentation

## 2017-04-03 DIAGNOSIS — N6459 Other signs and symptoms in breast: Secondary | ICD-10-CM | POA: Diagnosis present

## 2017-04-03 DIAGNOSIS — N6489 Other specified disorders of breast: Secondary | ICD-10-CM | POA: Diagnosis not present

## 2017-04-09 ENCOUNTER — Other Ambulatory Visit: Payer: Self-pay | Admitting: Obstetrics & Gynecology

## 2017-04-09 DIAGNOSIS — N6459 Other signs and symptoms in breast: Secondary | ICD-10-CM

## 2017-04-10 ENCOUNTER — Other Ambulatory Visit: Payer: Self-pay | Admitting: Obstetrics & Gynecology

## 2017-04-10 DIAGNOSIS — R928 Other abnormal and inconclusive findings on diagnostic imaging of breast: Secondary | ICD-10-CM

## 2017-05-07 ENCOUNTER — Ambulatory Visit
Admission: RE | Admit: 2017-05-07 | Discharge: 2017-05-07 | Disposition: A | Discharge status: Home / Self Care | Payer: 59 | Source: Ambulatory Visit | Attending: Obstetrics & Gynecology | Admitting: Obstetrics & Gynecology

## 2017-05-07 DIAGNOSIS — N6489 Other specified disorders of breast: Secondary | ICD-10-CM | POA: Diagnosis not present

## 2017-05-07 DIAGNOSIS — R928 Other abnormal and inconclusive findings on diagnostic imaging of breast: Secondary | ICD-10-CM | POA: Diagnosis not present

## 2017-06-06 ENCOUNTER — Ambulatory Visit
Admission: RE | Admit: 2017-06-06 | Discharge: 2017-06-06 | Disposition: A | Discharge status: Home / Self Care | Payer: 59 | Source: Ambulatory Visit | Attending: Obstetrics & Gynecology | Admitting: Obstetrics & Gynecology

## 2017-06-06 DIAGNOSIS — N6459 Other signs and symptoms in breast: Secondary | ICD-10-CM

## 2017-06-06 DIAGNOSIS — N6310 Unspecified lump in the right breast, unspecified quadrant: Secondary | ICD-10-CM | POA: Diagnosis not present

## 2017-06-06 MED ORDER — GADOBENATE DIMEGLUMINE 529 MG/ML IV SOLN
15.0000 mL | Freq: Once | INTRAVENOUS | Status: AC | PRN
  Administered 2017-06-06: 13 mL via INTRAVENOUS

## 2017-06-15 ENCOUNTER — Other Ambulatory Visit: Payer: Self-pay | Admitting: Obstetrics & Gynecology

## 2017-06-15 DIAGNOSIS — N6459 Other signs and symptoms in breast: Secondary | ICD-10-CM

## 2017-06-17 ENCOUNTER — Other Ambulatory Visit: Payer: Self-pay | Admitting: Obstetrics & Gynecology

## 2017-06-17 DIAGNOSIS — N6459 Other signs and symptoms in breast: Secondary | ICD-10-CM

## 2017-06-18 ENCOUNTER — Ambulatory Visit
Admission: RE | Admit: 2017-06-18 | Discharge: 2017-06-18 | Disposition: A | Discharge status: Home / Self Care | Payer: 59 | Source: Ambulatory Visit | Attending: Obstetrics & Gynecology | Admitting: Obstetrics & Gynecology

## 2017-06-18 DIAGNOSIS — N6489 Other specified disorders of breast: Secondary | ICD-10-CM | POA: Diagnosis not present

## 2017-06-18 DIAGNOSIS — N6459 Other signs and symptoms in breast: Secondary | ICD-10-CM

## 2017-06-24 ENCOUNTER — Other Ambulatory Visit: Payer: Self-pay | Admitting: Obstetrics & Gynecology

## 2017-06-24 DIAGNOSIS — N6459 Other signs and symptoms in breast: Secondary | ICD-10-CM

## 2017-06-30 ENCOUNTER — Ambulatory Visit
Admission: RE | Admit: 2017-06-30 | Discharge: 2017-06-30 | Disposition: A | Discharge status: Home / Self Care | Payer: 59 | Source: Ambulatory Visit | Attending: Obstetrics & Gynecology | Admitting: Obstetrics & Gynecology

## 2017-06-30 DIAGNOSIS — N6459 Other signs and symptoms in breast: Secondary | ICD-10-CM

## 2017-06-30 DIAGNOSIS — D242 Benign neoplasm of left breast: Secondary | ICD-10-CM | POA: Diagnosis not present

## 2017-06-30 DIAGNOSIS — N6489 Other specified disorders of breast: Secondary | ICD-10-CM | POA: Diagnosis not present

## 2017-06-30 MED ORDER — GADOBENATE DIMEGLUMINE 529 MG/ML IV SOLN
13.0000 mL | Freq: Once | INTRAVENOUS | Status: AC | PRN
  Administered 2017-06-30: 13 mL via INTRAVENOUS

## 2017-07-02 HISTORY — PX: BREAST BIOPSY: SHX20

## 2017-07-09 ENCOUNTER — Encounter: Payer: Self-pay | Admitting: General Surgery

## 2017-07-09 ENCOUNTER — Other Ambulatory Visit: Payer: Self-pay | Admitting: General Surgery

## 2017-07-09 DIAGNOSIS — N6459 Other signs and symptoms in breast: Secondary | ICD-10-CM | POA: Diagnosis not present

## 2017-07-09 DIAGNOSIS — Z947 Corneal transplant status: Secondary | ICD-10-CM | POA: Diagnosis not present

## 2017-07-09 DIAGNOSIS — D242 Benign neoplasm of left breast: Secondary | ICD-10-CM | POA: Diagnosis not present

## 2017-07-13 ENCOUNTER — Other Ambulatory Visit: Payer: Self-pay | Admitting: General Surgery

## 2017-07-13 DIAGNOSIS — D242 Benign neoplasm of left breast: Secondary | ICD-10-CM

## 2017-07-29 ENCOUNTER — Encounter (HOSPITAL_BASED_OUTPATIENT_CLINIC_OR_DEPARTMENT_OTHER): Payer: Self-pay | Admitting: *Deleted

## 2017-07-31 ENCOUNTER — Ambulatory Visit
Admission: RE | Admit: 2017-07-31 | Discharge: 2017-07-31 | Disposition: A | Discharge status: Home / Self Care | Payer: 59 | Source: Ambulatory Visit | Attending: General Surgery | Admitting: General Surgery

## 2017-07-31 DIAGNOSIS — D242 Benign neoplasm of left breast: Secondary | ICD-10-CM | POA: Diagnosis not present

## 2017-07-31 NOTE — Progress Notes (Signed)
Ensure pre surgery drink given with instructions to complete by 0400 dos, pt verbalized understanding. 

## 2017-08-02 NOTE — H&P (Signed)
[place] Location: [facility] Patient #: [id number] DOB: August 16, 1971 Married / Language: English / Race: Black or African American Female       History of Present Illness       The patient is a 46 year old female who presents with a breast mass. This is a 46 year old female, referred by Dr. Enriqueta Shutter at the breast and agrees were for evaluation of left nipple inversion and intraductal papilloma left breast, inner, slightly upper quadrant.     She has a history of a right breast biopsy by Dr. Orbie Hurst in [place] in 2014 for benign fibroadenoma. No other breast problems. She gives a six-month history of left nipple inversion. No pain or drainage. She had mammograms and ultrasound recently which were negative. She had MRI which showed no subareolar abnormality but there was a 3 mm mass in the left breast inner, slightly upper. It was guided biopsy of this area was performed and shows papilloma. The clip is 1.5 cm lateral to the biopsy cavity.     Past history significant for the right breast biopsy. Corneal transplants. Cesarean section.     Family history reveals sister was recently diagnosed with in situ breast cancer. Details unknown. No other breast or ovarian cancer. One uncle had prostate cancer. 1 uncle had colon cancer.      Social history reveals she is married with 2 children and lives in [place]. She works as an Optometrist for [employer]. Denies alcohol or tobacco.      We had a 30 minute conversation. I told her that she probably does not have cancer but that excisional biopsy was warranted to be 809% certain as the risk of cancer in this setting could be as high as 8% or so. I told her that the papilloma and the nipple inversion may or may not be related. I discussed the indications, details, techniques, and numerous risk of this surgery. She is aware the risk of bleeding, infection, nerve damage with chronic pain or numbness, further flattening of  the areola, reoperation of cancer. She understands all these issues. All of her questions are answered.    She states that she does not want to do anything unless it is necessary. I told her that I recommended the surgery based on the statistical probabilities. She knows that she probably does not have cancer. She understands all of this. She states that she is going to go home and talk this over with her husband. She thinks she will call me back to schedule surgery. I told her that was fine and that she could take her time to make a decision. I made it clear that I recommended the surgery. If she decides against surgery I offered to follow her closely and to repeat imaging of the left breast in 6 months and see me at that time.     She called back and scheduled surgery.     Past Surgical History  Breast Biopsy  Bilateral. Cesarean Section - Multiple   Diagnostic Studies History  Colonoscopy  >10 years ago Mammogram  within last year Pap Smear  1-5 years ago  Medication History  Multiple Vitamins (Oral) Active. Iron (325 (65 Fe)MG Tablet, Oral) Active. Medications Reconciled  Social History  Caffeine use  Carbonated beverages, Tea. No alcohol use  No drug use  Tobacco use  Never smoker.  Family History  Arthritis  Father, Mother. Bleeding disorder  Sister. Breast Cancer  Sister. Cancer  Father. Cerebrovascular Accident  Mother. Diabetes Mellitus  Brother. Heart Disease  Brother, Father. Heart disease in female family member before age 27  Hypertension  Brother, Father, Mother, Sister. Kidney Disease  Father.  Pregnancy / Birth History  Age at menarche  34 years. Gravida  3 Irregular periods  Maternal age  26-25 Para  2  Other Problems  No pertinent past medical history     Review of Systems  General Not Present- Appetite Loss, Chills, Fatigue, Fever, Night Sweats, Weight Gain and Weight Loss. Skin Not Present- Change in  Wart/Mole, Dryness, Hives, Jaundice, New Lesions, Non-Healing Wounds, Rash and Ulcer. HEENT Not Present- Earache, Hearing Loss, Hoarseness, Nose Bleed, Oral Ulcers, Ringing in the Ears, Seasonal Allergies, Sinus Pain, Sore Throat, Visual Disturbances, Wears glasses/contact lenses and Yellow Eyes. Respiratory Not Present- Bloody sputum, Chronic Cough, Difficulty Breathing, Snoring and Wheezing. Breast Not Present- Breast Mass, Breast Pain, Nipple Discharge and Skin Changes. Gastrointestinal Not Present- Abdominal Pain, Bloating, Bloody Stool, Change in Bowel Habits, Chronic diarrhea, Constipation, Difficulty Swallowing, Excessive gas, Gets full quickly at meals, Hemorrhoids, Indigestion, Nausea, Rectal Pain and Vomiting. Female Genitourinary Not Present- Frequency, Nocturia, Painful Urination, Pelvic Pain and Urgency. Musculoskeletal Not Present- Back Pain, Joint Pain, Joint Stiffness, Muscle Pain, Muscle Weakness and Swelling of Extremities. Psychiatric Not Present- Anxiety, Bipolar, Change in Sleep Pattern, Depression, Fearful and Frequent crying. Endocrine Not Present- Cold Intolerance, Excessive Hunger, Hair Changes, Heat Intolerance, Hot flashes and New Diabetes. Hematology Not Present- Blood Thinners, Easy Bruising, Excessive bleeding, Gland problems, HIV and Persistent Infections.  Vitals Weight: 136.2 lb Height: 65in Body Surface Area: 1.68 m Body Mass Index: 22.66 kg/m  Temp.: 98.6F  Pulse: 69 (Regular)  BP: 120/80 (Sitting, Left Arm, Standard)    Physical Exam  General Mental Status-Alert. General Appearance-Consistent with stated age. Hydration-Well hydrated. Voice-Normal. Note: Very pleasant. Very cooperative. Healthy-appearing BMI 22.6   Head and Neck Head-normocephalic, atraumatic with no lesions or palpable masses. Trachea-midline. Thyroid Gland Characteristics - normal size and consistency.  Eye Eyeball - Bilateral-Extraocular movements  intact. Sclera/Conjunctiva - Bilateral-No scleral icterus.  Chest and Lung Exam Chest and lung exam reveals -quiet, even and easy respiratory effort with no use of accessory muscles and on auscultation, normal breath sounds, no adventitious sounds and normal vocal resonance. Inspection Chest Wall - Normal. Back - normal.  Breast Note: Medium-sized. Not large. Right nipple projection only. Left nipple is inverted. It is soft without mass or other skin change. I cannot elicit any drainage. I can pop the left nipple back out. Recent biopsy site left breast, 9:30 position. A little tender but no significant hematoma. No axillary adenopathy. No other skin changes. She does have a well-healed circumareolar scar right breast, upper outer quadrant.   Cardiovascular Cardiovascular examination reveals -normal heart sounds, regular rate and rhythm with no murmurs and normal pedal pulses bilaterally.  Abdomen Inspection Inspection of the abdomen reveals - No Hernias. Skin - Scar - Note: Lower midline scar. Palpation/Percussion Palpation and Percussion of the abdomen reveal - Soft, Non Tender, No Rebound tenderness, No Rigidity (guarding) and No hepatosplenomegaly. Auscultation Auscultation of the abdomen reveals - Bowel sounds normal.  Neurologic Neurologic evaluation reveals -alert and oriented x 3 with no impairment of recent or remote memory. Mental Status-Normal.  Musculoskeletal Normal Exam - Left-Upper Extremity Strength Normal and Lower Extremity Strength Normal. Normal Exam - Right-Upper Extremity Strength Normal and Lower Extremity Strength Normal.  Lymphatic Head & Neck  General Head & Neck Lymphatics: Bilateral - Description - Normal. Axillary  General Axillary Region: Bilateral - Description - Normal. Tenderness - Non Tender. Femoral & Inguinal  Generalized Femoral & Inguinal Lymphatics: Bilateral - Description - Normal. Tenderness - Non  Tender.    Assessment & Plan  PAPILLOMA OF LEFT BREAST (D24.2)  you report left nipple inversion for 6 months without pain or discharge or mass You have had extensive x-rays and evaluation recently Your MRI does not show any cancer behind the nipple The MRI did show a tiny mass in the upper inner quadrant and biopsy of that shows intraductal papilloma  These findings are associated with early stage cancer in a low percentage of cases but it could be as high as 8 or 10%. I have recommended conservative excisional biopsy with radioactive seed localization Most likely we can use an ncision at the areolar margin I discussed the indications, techniques, and risk of the surgery in detail  you stated that you're going to go home and discuss this with her husband you stated that he will probably call back to schedule surgery That is reasonable. This is not an emergency. I will standby pending your decision  INVERSION, NIPPLE (N64.59) CORNEAL TRANSPLANT STATUS (Z94.7) HISTORY OF C-SECTION (K24.469) FAMILY HISTORY OF BREAST CANCER IN SISTER (Z80.3)    Yitzel Shasteen M. Dalbert Batman, M.D., [facility] General and Minimally invasive Surgery Breast and Colorectal Surgery Office:   [phone] Pager:   [phone]

## 2017-08-03 ENCOUNTER — Ambulatory Visit (HOSPITAL_BASED_OUTPATIENT_CLINIC_OR_DEPARTMENT_OTHER): Payer: 59 | Admitting: Anesthesiology

## 2017-08-03 ENCOUNTER — Encounter (HOSPITAL_BASED_OUTPATIENT_CLINIC_OR_DEPARTMENT_OTHER): Payer: Self-pay | Admitting: Emergency Medicine

## 2017-08-03 ENCOUNTER — Ambulatory Visit
Admission: RE | Admit: 2017-08-03 | Discharge: 2017-08-03 | Disposition: A | Discharge status: Home / Self Care | Payer: 59 | Source: Ambulatory Visit | Attending: General Surgery | Admitting: General Surgery

## 2017-08-03 ENCOUNTER — Ambulatory Visit (HOSPITAL_BASED_OUTPATIENT_CLINIC_OR_DEPARTMENT_OTHER)
Admission: RE | Admit: 2017-08-03 | Discharge: 2017-08-03 | Disposition: A | Discharge status: Home / Self Care | Payer: 59 | Source: Ambulatory Visit | Attending: General Surgery | Admitting: General Surgery

## 2017-08-03 ENCOUNTER — Encounter (HOSPITAL_BASED_OUTPATIENT_CLINIC_OR_DEPARTMENT_OTHER)
Admission: RE | Disposition: A | Discharge status: Home / Self Care | Payer: Self-pay | Source: Ambulatory Visit | Attending: General Surgery

## 2017-08-03 DIAGNOSIS — Z803 Family history of malignant neoplasm of breast: Secondary | ICD-10-CM | POA: Insufficient documentation

## 2017-08-03 DIAGNOSIS — N6042 Mammary duct ectasia of left breast: Secondary | ICD-10-CM | POA: Insufficient documentation

## 2017-08-03 DIAGNOSIS — N6459 Other signs and symptoms in breast: Secondary | ICD-10-CM | POA: Diagnosis not present

## 2017-08-03 DIAGNOSIS — N6022 Fibroadenosis of left breast: Secondary | ICD-10-CM | POA: Diagnosis not present

## 2017-08-03 DIAGNOSIS — Z800 Family history of malignant neoplasm of digestive organs: Secondary | ICD-10-CM | POA: Insufficient documentation

## 2017-08-03 DIAGNOSIS — D242 Benign neoplasm of left breast: Secondary | ICD-10-CM

## 2017-08-03 DIAGNOSIS — N6012 Diffuse cystic mastopathy of left breast: Secondary | ICD-10-CM | POA: Diagnosis not present

## 2017-08-03 DIAGNOSIS — Z947 Corneal transplant status: Secondary | ICD-10-CM | POA: Diagnosis not present

## 2017-08-03 HISTORY — PX: BREAST EXCISIONAL BIOPSY: SUR124

## 2017-08-03 HISTORY — DX: Benign neoplasm of left breast: D24.2

## 2017-08-03 HISTORY — PX: BREAST LUMPECTOMY WITH RADIOACTIVE SEED LOCALIZATION: SHX6424

## 2017-08-03 SURGERY — BREAST LUMPECTOMY WITH RADIOACTIVE SEED LOCALIZATION
Anesthesia: General | Site: Breast | Laterality: Left

## 2017-08-03 MED ORDER — ACETAMINOPHEN 500 MG PO TABS
1000.0000 mg | ORAL_TABLET | ORAL | Status: AC
  Administered 2017-08-03: 1000 mg via ORAL

## 2017-08-03 MED ORDER — BUPIVACAINE-EPINEPHRINE (PF) 0.5% -1:200000 IJ SOLN
INTRAMUSCULAR | Status: DC | PRN
  Administered 2017-08-03: 10 mL

## 2017-08-03 MED ORDER — CEFAZOLIN SODIUM-DEXTROSE 2-4 GM/100ML-% IV SOLN
2.0000 g | INTRAVENOUS | Status: AC
  Administered 2017-08-03: 2 g via INTRAVENOUS

## 2017-08-03 MED ORDER — FENTANYL CITRATE (PF) 100 MCG/2ML IJ SOLN
INTRAMUSCULAR | Status: AC
  Filled 2017-08-03: qty 2

## 2017-08-03 MED ORDER — GABAPENTIN 300 MG PO CAPS
300.0000 mg | ORAL_CAPSULE | ORAL | Status: AC
  Administered 2017-08-03: 300 mg via ORAL

## 2017-08-03 MED ORDER — BUPIVACAINE-EPINEPHRINE (PF) 0.5% -1:200000 IJ SOLN
INTRAMUSCULAR | Status: AC
  Filled 2017-08-03: qty 90

## 2017-08-03 MED ORDER — GABAPENTIN 300 MG PO CAPS
ORAL_CAPSULE | ORAL | Status: AC
  Filled 2017-08-03: qty 1

## 2017-08-03 MED ORDER — SODIUM CHLORIDE 0.9 % IJ SOLN
INTRAMUSCULAR | Status: AC
  Filled 2017-08-03: qty 10

## 2017-08-03 MED ORDER — ONDANSETRON HCL 4 MG/2ML IJ SOLN: 4.0000 mg | Freq: Four times a day (QID) | INTRAMUSCULAR | Status: DC | PRN

## 2017-08-03 MED ORDER — CHLORHEXIDINE GLUCONATE CLOTH 2 % EX PADS: 6.0000 | MEDICATED_PAD | Freq: Once | CUTANEOUS | Status: DC

## 2017-08-03 MED ORDER — OXYCODONE HCL 5 MG/5ML PO SOLN: 5.0000 mg | Freq: Once | ORAL | Status: DC | PRN

## 2017-08-03 MED ORDER — FENTANYL CITRATE (PF) 100 MCG/2ML IJ SOLN
50.0000 ug | INTRAMUSCULAR | Status: AC | PRN
  Administered 2017-08-03: 25 ug via INTRAVENOUS
  Administered 2017-08-03: 50 ug via INTRAVENOUS
  Administered 2017-08-03: 25 ug via INTRAVENOUS

## 2017-08-03 MED ORDER — OXYCODONE HCL 5 MG PO TABS: 5.0000 mg | ORAL_TABLET | Freq: Once | ORAL | Status: DC | PRN

## 2017-08-03 MED ORDER — KETOROLAC TROMETHAMINE 30 MG/ML IJ SOLN
INTRAMUSCULAR | Status: AC
  Filled 2017-08-03: qty 1

## 2017-08-03 MED ORDER — PROPOFOL 10 MG/ML IV BOLUS
INTRAVENOUS | Status: AC
  Filled 2017-08-03: qty 20

## 2017-08-03 MED ORDER — ONDANSETRON HCL 4 MG/2ML IJ SOLN
INTRAMUSCULAR | Status: DC | PRN
  Administered 2017-08-03: 4 mg via INTRAVENOUS

## 2017-08-03 MED ORDER — ONDANSETRON HCL 4 MG/2ML IJ SOLN
INTRAMUSCULAR | Status: AC
  Filled 2017-08-03: qty 2

## 2017-08-03 MED ORDER — METHYLENE BLUE 0.5 % INJ SOLN
INTRAVENOUS | Status: AC
  Filled 2017-08-03: qty 10

## 2017-08-03 MED ORDER — HYDROCODONE-ACETAMINOPHEN 5-325 MG PO TABS: 1.0000 | ORAL_TABLET | Freq: Four times a day (QID) | ORAL | 0 refills | Status: DC | PRN

## 2017-08-03 MED ORDER — FENTANYL CITRATE (PF) 100 MCG/2ML IJ SOLN
25.0000 ug | INTRAMUSCULAR | Status: DC | PRN
  Administered 2017-08-03: 25 ug via INTRAVENOUS

## 2017-08-03 MED ORDER — LIDOCAINE HCL (CARDIAC) 20 MG/ML IV SOLN
INTRAVENOUS | Status: DC | PRN
  Administered 2017-08-03: 50 mg via INTRAVENOUS

## 2017-08-03 MED ORDER — SCOPOLAMINE 1 MG/3DAYS TD PT72: 1.0000 | MEDICATED_PATCH | Freq: Once | TRANSDERMAL | Status: DC | PRN

## 2017-08-03 MED ORDER — MIDAZOLAM HCL 2 MG/2ML IJ SOLN
INTRAMUSCULAR | Status: AC
  Filled 2017-08-03: qty 2

## 2017-08-03 MED ORDER — LACTATED RINGERS IV SOLN
INTRAVENOUS | Status: DC
  Administered 2017-08-03: 07:00:00 via INTRAVENOUS

## 2017-08-03 MED ORDER — HEPARIN SOD (PORK) LOCK FLUSH 100 UNIT/ML IV SOLN
INTRAVENOUS | Status: AC
  Filled 2017-08-03: qty 5

## 2017-08-03 MED ORDER — MIDAZOLAM HCL 2 MG/2ML IJ SOLN
1.0000 mg | INTRAMUSCULAR | Status: DC | PRN
  Administered 2017-08-03: 2 mg via INTRAVENOUS

## 2017-08-03 MED ORDER — PROPOFOL 10 MG/ML IV BOLUS
INTRAVENOUS | Status: DC | PRN
  Administered 2017-08-03: 200 mg via INTRAVENOUS

## 2017-08-03 MED ORDER — ACETAMINOPHEN 500 MG PO TABS
ORAL_TABLET | ORAL | Status: AC
  Filled 2017-08-03: qty 2

## 2017-08-03 MED ORDER — CEFAZOLIN SODIUM-DEXTROSE 2-4 GM/100ML-% IV SOLN
INTRAVENOUS | Status: AC
  Filled 2017-08-03: qty 100

## 2017-08-03 MED ORDER — DEXAMETHASONE SODIUM PHOSPHATE 10 MG/ML IJ SOLN
INTRAMUSCULAR | Status: DC | PRN
  Administered 2017-08-03: 5 mg via INTRAVENOUS

## 2017-08-03 MED ORDER — EPHEDRINE SULFATE 50 MG/ML IJ SOLN
INTRAMUSCULAR | Status: DC | PRN
  Administered 2017-08-03: 10 mg via INTRAVENOUS

## 2017-08-03 MED ORDER — CELECOXIB 200 MG PO CAPS
200.0000 mg | ORAL_CAPSULE | ORAL | Status: AC
  Administered 2017-08-03: 200 mg via ORAL

## 2017-08-03 MED ORDER — DEXAMETHASONE SODIUM PHOSPHATE 10 MG/ML IJ SOLN
INTRAMUSCULAR | Status: AC
  Filled 2017-08-03: qty 1

## 2017-08-03 MED ORDER — LIDOCAINE 2% (20 MG/ML) 5 ML SYRINGE
INTRAMUSCULAR | Status: AC
  Filled 2017-08-03: qty 5

## 2017-08-03 MED ORDER — HEPARIN (PORCINE) IN NACL 2-0.9 UNIT/ML-% IJ SOLN
INTRAMUSCULAR | Status: AC
  Filled 2017-08-03: qty 500

## 2017-08-03 MED ORDER — CELECOXIB 200 MG PO CAPS
ORAL_CAPSULE | ORAL | Status: AC
  Filled 2017-08-03: qty 1

## 2017-08-03 SURGICAL SUPPLY — 49 items
ADH SKN CLS APL DERMABOND .7 (GAUZE/BANDAGES/DRESSINGS) ×1
APPLIER CLIP 9.375 MED OPEN (MISCELLANEOUS) ×3
APR CLP MED 9.3 20 MLT OPN (MISCELLANEOUS) ×1
BINDER BREAST MEDIUM (GAUZE/BANDAGES/DRESSINGS) ×2 IMPLANT
BLADE HEX COATED 2.75 (ELECTRODE) ×3 IMPLANT
BLADE SURG 15 STRL LF DISP TIS (BLADE) ×1 IMPLANT
BLADE SURG 15 STRL SS (BLADE) ×3
CANISTER SUCT 1200ML W/VALVE (MISCELLANEOUS) ×3 IMPLANT
CHLORAPREP W/TINT 26ML (MISCELLANEOUS) ×3 IMPLANT
CLIP APPLIE 9.375 MED OPEN (MISCELLANEOUS) IMPLANT
COVER BACK TABLE 60X90IN (DRAPES) ×3 IMPLANT
COVER MAYO STAND STRL (DRAPES) ×3 IMPLANT
COVER PROBE W GEL 5X96 (DRAPES) ×3 IMPLANT
DERMABOND ADVANCED (GAUZE/BANDAGES/DRESSINGS) ×2
DERMABOND ADVANCED .7 DNX12 (GAUZE/BANDAGES/DRESSINGS) ×1 IMPLANT
DEVICE DUBIN W/COMP PLATE 8390 (MISCELLANEOUS) ×3 IMPLANT
DRAPE LAPAROSCOPIC ABDOMINAL (DRAPES) ×3 IMPLANT
DRAPE UTILITY XL STRL (DRAPES) ×3 IMPLANT
DRSG PAD ABDOMINAL 8X10 ST (GAUZE/BANDAGES/DRESSINGS) ×2 IMPLANT
ELECT REM PT RETURN 9FT ADLT (ELECTROSURGICAL) ×3
ELECTRODE REM PT RTRN 9FT ADLT (ELECTROSURGICAL) ×1 IMPLANT
GAUZE SPONGE 4X4 12PLY STRL LF (GAUZE/BANDAGES/DRESSINGS) ×2 IMPLANT
GLOVE BIO SURGEON STRL SZ 6.5 (GLOVE) ×1 IMPLANT
GLOVE BIO SURGEONS STRL SZ 6.5 (GLOVE) ×1
GLOVE EUDERMIC 7 POWDERFREE (GLOVE) ×3 IMPLANT
GLOVE SURG SS PI 6.5 STRL IVOR (GLOVE) ×2 IMPLANT
GOWN STRL REUS W/ TWL LRG LVL3 (GOWN DISPOSABLE) ×1 IMPLANT
GOWN STRL REUS W/ TWL XL LVL3 (GOWN DISPOSABLE) ×1 IMPLANT
GOWN STRL REUS W/TWL LRG LVL3 (GOWN DISPOSABLE) ×3
GOWN STRL REUS W/TWL XL LVL3 (GOWN DISPOSABLE) ×3
KIT MARKER MARGIN INK (KITS) ×3 IMPLANT
NDL HYPO 25X1 1.5 SAFETY (NEEDLE) ×1 IMPLANT
NEEDLE HYPO 25X1 1.5 SAFETY (NEEDLE) ×3 IMPLANT
NS IRRIG 1000ML POUR BTL (IV SOLUTION) ×3 IMPLANT
PACK BASIN DAY SURGERY FS (CUSTOM PROCEDURE TRAY) ×3 IMPLANT
PENCIL BUTTON HOLSTER BLD 10FT (ELECTRODE) ×3 IMPLANT
SHEET MEDIUM DRAPE 40X70 STRL (DRAPES) ×2 IMPLANT
SLEEVE SCD COMPRESS KNEE MED (MISCELLANEOUS) ×3 IMPLANT
SPONGE LAP 4X18 X RAY DECT (DISPOSABLE) ×3 IMPLANT
SUT MNCRL AB 4-0 PS2 18 (SUTURE) ×3 IMPLANT
SUT SILK 2 0 SH (SUTURE) ×3 IMPLANT
SUT VIC AB 2-0 CT1 27 (SUTURE) ×3
SUT VIC AB 2-0 CT1 TAPERPNT 27 (SUTURE) IMPLANT
SUT VICRYL 3-0 CR8 SH (SUTURE) ×3 IMPLANT
SYR 10ML LL (SYRINGE) ×3 IMPLANT
TOWEL OR 17X24 6PK STRL BLUE (TOWEL DISPOSABLE) ×3 IMPLANT
TUBE CONNECTING 20'X1/4 (TUBING) ×1
TUBE CONNECTING 20X1/4 (TUBING) ×2 IMPLANT
YANKAUER SUCT BULB TIP NO VENT (SUCTIONS) ×3 IMPLANT

## 2017-08-03 NOTE — Anesthesia Postprocedure Evaluation (Signed)
Anesthesia Post Note  Patient: [place]  Procedure(s) Performed: Procedure(s) (LRB): LEFT BREAST LUMPECTOMY WITH RADIOACTIVE SEED LOCALIZATION (Left)     Patient location during evaluation: PACU Anesthesia Type: General Level of consciousness: awake and alert Pain management: pain level controlled Vital Signs Assessment: post-procedure vital signs reviewed and stable Respiratory status: spontaneous breathing, nonlabored ventilation, respiratory function stable and patient connected to nasal cannula oxygen Cardiovascular status: blood pressure returned to baseline and stable Postop Assessment: no signs of nausea or vomiting Anesthetic complications: no    Last Vitals:  Vitals:   08/03/17 0900 08/03/17 0915  BP: 126/84 121/80  Pulse: 61 (!) 59  Resp: (!) 8 11  Temp:    SpO2: 100% 100%    Last Pain:  Vitals:   08/03/17 0900  TempSrc:   PainSc: [place]

## 2017-08-03 NOTE — Interval H&P Note (Signed)
History and Physical Interval Note:  08/03/2017 7:09 AM  [place]  has presented today for surgery, with the diagnosis of LEFT NIPPLE INVERSION, PAPILLOMA OF LEFT BREAST  The various methods of treatment have been discussed with the patient and family. After consideration of risks, benefits and other options for treatment, the patient has consented to  Procedure(s): LEFT BREAST LUMPECTOMY WITH RADIOACTIVE SEED LOCALIZATION (Left) as a surgical intervention .  The patient's history has been reviewed, patient examined, no change in status, stable for surgery.  I have reviewed the patient's chart and labs.  Questions were answered to the patient's satisfaction.     Adin Hector

## 2017-08-03 NOTE — Anesthesia Preprocedure Evaluation (Signed)
Anesthesia Evaluation  Patient identified by MRN, date of birth, ID band Patient awake    Reviewed: Allergy & Precautions, H&P , NPO status , Patient's Chart, lab work & pertinent test results  Airway Mallampati: II   Neck ROM: full    Dental   Pulmonary neg pulmonary ROS,    breath sounds clear to auscultation       Cardiovascular negative cardio ROS   Rhythm:regular Rate:Normal     Neuro/Psych    GI/Hepatic   Endo/Other    Renal/GU      Musculoskeletal   Abdominal   Peds  Hematology   Anesthesia Other Findings   Reproductive/Obstetrics                             Anesthesia Physical Anesthesia Plan  ASA: I  Anesthesia Plan: General   Post-op Pain Management:    Induction: Intravenous  PONV Risk Score and Plan: 3 and Ondansetron, Dexamethasone and Midazolam  Airway Management Planned: LMA  Additional Equipment:   Intra-op Plan:   Post-operative Plan:   Informed Consent: I have reviewed the patients History and Physical, chart, labs and discussed the procedure including the risks, benefits and alternatives for the proposed anesthesia with the patient or authorized representative who has indicated his/her understanding and acceptance.     Plan Discussed with: CRNA, Anesthesiologist and Surgeon  Anesthesia Plan Comments:         Anesthesia Quick Evaluation

## 2017-08-03 NOTE — Transfer of Care (Signed)
Immediate Anesthesia Transfer of Care Note  Patient: [place]  Procedure(s) Performed: Procedure(s): LEFT BREAST LUMPECTOMY WITH RADIOACTIVE SEED LOCALIZATION (Left)  Patient Location: PACU  Anesthesia Type:General  Level of Consciousness: awake, alert  and oriented  Airway & Oxygen Therapy: Patient Spontanous Breathing and Patient connected to nasal cannula oxygen  Post-op Assessment: Report given to RN and Post -op Vital signs reviewed and stable  Post vital signs: Reviewed and stable  Last Vitals:  Vitals:   08/03/17 0638  BP: 117/80  Pulse: 71  Resp: 18  Temp: 36.7 C  SpO2: 100%    Last Pain:  Vitals:   08/03/17 0638  TempSrc: Oral  PainSc: 0-No pain         Complications: No apparent anesthesia complications

## 2017-08-03 NOTE — Anesthesia Procedure Notes (Signed)
Procedure Name: LMA Insertion Date/Time: 08/03/2017 7:39 AM Performed by: Bufford Spikes Pre-anesthesia Checklist: Patient identified, Emergency Drugs available, Suction available and Patient being monitored Patient Re-evaluated:Patient Re-evaluated prior to induction Oxygen Delivery Method: Circle system utilized Preoxygenation: Pre-oxygenation with 100% oxygen Induction Type: IV induction Ventilation: Mask ventilation without difficulty LMA: LMA inserted LMA Size: 3.0 Tube type: Oral Number of attempts: 1 Placement Confirmation: positive ETCO2 and breath sounds checked- equal and bilateral Tube secured with: Tape Dental Injury: Teeth and Oropharynx as per pre-operative assessment

## 2017-08-03 NOTE — Op Note (Signed)
Patient Name:           [place]   Date of Surgery:        08/03/2017  Pre op Diagnosis:      Papilloma left breast  Post op Diagnosis:    Same  Procedure:                 Left breast lumpectomy with radioactive seed localization and margin assessment  Surgeon:                     Edsel Petrin. Dalbert Batman, M.D., FACS  Assistant:                      OR staff   Indication for Assistant: N/A  Operative Indications:   This is a 46 year old female, referred by Dr. Enriqueta Shutter at the [facility] for evaluation of left nipple inversion and intraductal papilloma left breast, inner, slightly upper quadrant.     She has a history of a right breast biopsy by Dr. Orbie Hurst in [place] in 2014 for benign fibroadenoma. No other breast problems. She gives a six-month history of left nipple inversion. No pain or drainage. She had mammograms and ultrasound recently which were negative. She had MRI which showed no subareolar abnormality but there was a 3 mm mass in the left breast inner, slightly upper. It was guided biopsy of this area was performed and shows papilloma. The clip is 1.5 cm lateral to the biopsy cavity.     Family history reveals sister was recently diagnosed with in situ breast cancer. Details unknown. No other breast or ovarian cancer. One uncle had prostate cancer. 1 uncle had colon cancer.       I told her that she probably does not have cancer but that excisional biopsy was warranted to be 323% certain as the risk of cancer in this setting could be as high as 8% or so. I told her that the papilloma and the nipple inversion may or may not be related. I discussed the indications, details, techniques, and numerous risk of this surgery. She is aware the risk of bleeding, infection, nerve damage with chronic pain or numbness, further flattening of the areola, reoperation of cancer. She understands all these issues. All of her questions are answered.  She thought  about this for a couple  of days at home and then called back and decided to schedule surgery to clarify her diagnosis.   Operative Findings:       The marker clip and radioactive seed were relatively central and medial, just slightly medial to the areolar margin.  Middle depth.  I was able to make a circumareolar incision in a hidden scar technique.  The specimen mammogram looked good.  The marker clip and the seed were pretty much in the center of the specimen.  There was no gross palpable abnormality  Procedure in Detail:          Following the induction of general LMA anesthesia the patient's left breast was prepped and draped in a sterile fashion.  Surgical timeout was performed.  Intravenous antibiotics were given.  1% Xylocaine with epinephrine was used as a local field block.  I used the neoprobe identifying the location of the radioactive seed.  I planned and made a curvilinear incision at the medial areolar margin.  The lumpectomy was performed using the neoprobe and electrocautery.  The specimen was removed and marked with silk sutures and a  6 color ink kit  to orient the pathologist.  Specimen mammogram looked good as described above and the specimen was marked and sent to the lab.  Hemostasis was excellent.  The wound was irrigated.  The walls of the lumpectomy cavity were marked with 5 metal clips.  Breast tissues were reapproximated with interrupted sutures of 3-0 Vicryl and the skin closed with a running subcuticular 4-0 Monocryl and Dermabond.  Dry bandages and a breast binder were placed.  Patient tolerated the procedure well was taken to PACU in stable condition.  EBL 10 mL.  Counts correct.  Complications none.     Edsel Petrin. Dalbert Batman, M.D., FACS General and Minimally Invasive Surgery Breast and Colorectal Surgery   Addendum: I logged onto the [facility] website and reviewed her prescription medication history  08/03/2017 8:27 AM

## 2017-08-03 NOTE — Discharge Instructions (Signed)
[facility] °Office Phone Number [phone] ° °BREAST BIOPSY/ PARTIAL MASTECTOMY: POST OP INSTRUCTIONS ° °Always review your discharge instruction sheet given to you by the facility where your surgery was performed. ° °IF YOU HAVE DISABILITY OR FAMILY LEAVE FORMS, YOU MUST BRING THEM TO THE OFFICE FOR PROCESSING.  DO NOT GIVE THEM TO YOUR DOCTOR. ° °1. A prescription for pain medication may be given to you upon discharge.  Take your pain medication as prescribed, if needed.  If narcotic pain medicine is not needed, then you may take acetaminophen (Tylenol) or ibuprofen (Advil) as needed. °2. Take your usually prescribed medications unless otherwise directed °3. If you need a refill on your pain medication, please contact your pharmacy.  They will contact our office to request authorization.  Prescriptions will not be filled after 5pm or on week-ends. °4. You should eat very light the first 24 hours after surgery, such as soup, crackers, pudding, etc.  Resume your normal diet the day after surgery. °5. Most patients will experience some swelling and bruising in the breast.  Ice packs and a good support bra will help.  Swelling and bruising can take several days to resolve.  °6. It is common to experience some constipation if taking pain medication after surgery.  Increasing fluid intake and taking a stool softener will usually help or prevent this problem from occurring.  A mild laxative (Milk of Magnesia or Miralax) should be taken according to package directions if there are no bowel movements after 48 hours. °7. Unless discharge instructions indicate otherwise, you may remove your bandages 24-48 hours after surgery, and you may shower at that time.  You may have steri-strips (small skin tapes) in place directly over the incision.  These strips should be left on the skin for 7-10 days.  If your surgeon used skin glue on the incision, you may shower in 24 hours.  The glue will flake off over the  next 2-3 weeks.  Any sutures or staples will be removed at the office during your follow-up visit. °8. ACTIVITIES:  You may resume regular daily activities (gradually increasing) beginning the next day.  Wearing a good support bra or sports bra minimizes pain and swelling.  You may have sexual intercourse when it is comfortable. °a. You may drive when you no longer are taking prescription pain medication, you can comfortably wear a seatbelt, and you can safely maneuver your car and apply brakes. °b. RETURN TO WORK:  ______________________________________________________________________________________ °9. You should see your doctor in the office for a follow-up appointment approximately two weeks after your surgery.  Your doctor’s nurse will typically make your follow-up appointment when she calls you with your pathology report.  Expect your pathology report 2-3 business days after your surgery.  You may call to check if you do not hear from us after three days. °10. OTHER INSTRUCTIONS: _______________________________________________________________________________________________ _____________________________________________________________________________________________________________________________________ °_____________________________________________________________________________________________________________________________________ °_____________________________________________________________________________________________________________________________________ ° °WHEN TO CALL YOUR DOCTOR: °1. Fever over 101.0 °2. Nausea and/or vomiting. °3. Extreme swelling or bruising. °4. Continued bleeding from incision. °5. Increased pain, redness, or drainage from the incision. ° °The clinic staff is available to answer your questions during regular business hours.  Please don’t hesitate to call and ask to speak to one of the nurses for clinical concerns.  If you have a medical emergency, go to the nearest  emergency room or call 911.  A surgeon from [facility] is always on call at the hospital. ° °For further questions, please visit [email or web address]  ° ° ° ° °  Post Anesthesia Home Care Instructions ° °Activity: °Get plenty of rest for the remainder of the day. A responsible individual must stay with you for 24 hours following the procedure.  °For the next 24 hours, DO NOT: °-Drive a car °-Operate machinery °-Drink alcoholic beverages °-Take any medication unless instructed by your physician °-Make any legal decisions or sign important papers. ° °Meals: °Start with liquid foods such as gelatin or soup. Progress to regular foods as tolerated. Avoid greasy, spicy, heavy foods. If nausea and/or vomiting occur, drink only clear liquids until the nausea and/or vomiting subsides. Call your physician if vomiting continues. ° °Special Instructions/Symptoms: °Your throat may feel dry or sore from the anesthesia or the breathing tube placed in your throat during surgery. If this causes discomfort, gargle with warm salt water. The discomfort should disappear within 24 hours. ° °If you had a scopolamine patch placed behind your ear for the management of post- operative nausea and/or vomiting: ° °1. The medication in the patch is effective for 72 hours, after which it should be removed.  Wrap patch in a tissue and discard in the trash. Wash hands thoroughly with soap and water. °2. You may remove the patch earlier than 72 hours if you experience unpleasant side effects which may include dry mouth, dizziness or visual disturbances. °3. Avoid touching the patch. Wash your hands with soap and water after contact with the patch. °  ° °

## 2017-08-04 ENCOUNTER — Encounter (HOSPITAL_BASED_OUTPATIENT_CLINIC_OR_DEPARTMENT_OTHER): Payer: Self-pay | Admitting: General Surgery

## 2017-12-06 DIAGNOSIS — R8290 Unspecified abnormal findings in urine: Secondary | ICD-10-CM | POA: Diagnosis not present

## 2017-12-06 DIAGNOSIS — R319 Hematuria, unspecified: Secondary | ICD-10-CM | POA: Diagnosis not present

## 2017-12-06 DIAGNOSIS — N390 Urinary tract infection, site not specified: Secondary | ICD-10-CM | POA: Diagnosis not present

## 2017-12-14 IMAGING — MG 2D DIGITAL DIAGNOSTIC UNILATERAL LEFT MAMMOGRAM WITH CAD AND ADJ
6 series · 6 of 14 positions shown · non-contrast
Comparison: Previous exam(s).

CLINICAL DATA: 45-year-old patient has noticed left breast nipple
inversion over the past 6 months. She does not palpate a lump. She
has no skin changes, and she denies nipple discharge. She states
that she believes she is going through menopause, and she is no
longer having menstrual cycles.

EXAM:
2D DIGITAL DIAGNOSTIC LEFT MAMMOGRAM WITH CAD AND ADJUNCT TOMO
ULTRASOUND LEFT BREAST

[L MLO synth-2D]
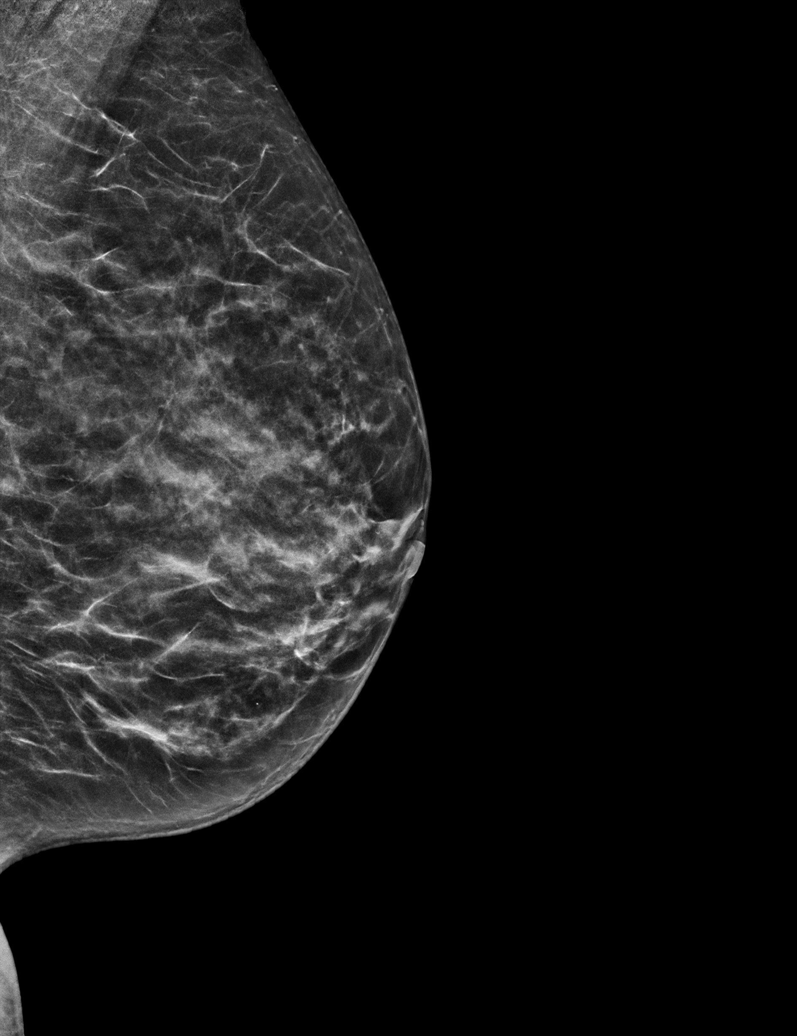

[L MLO]
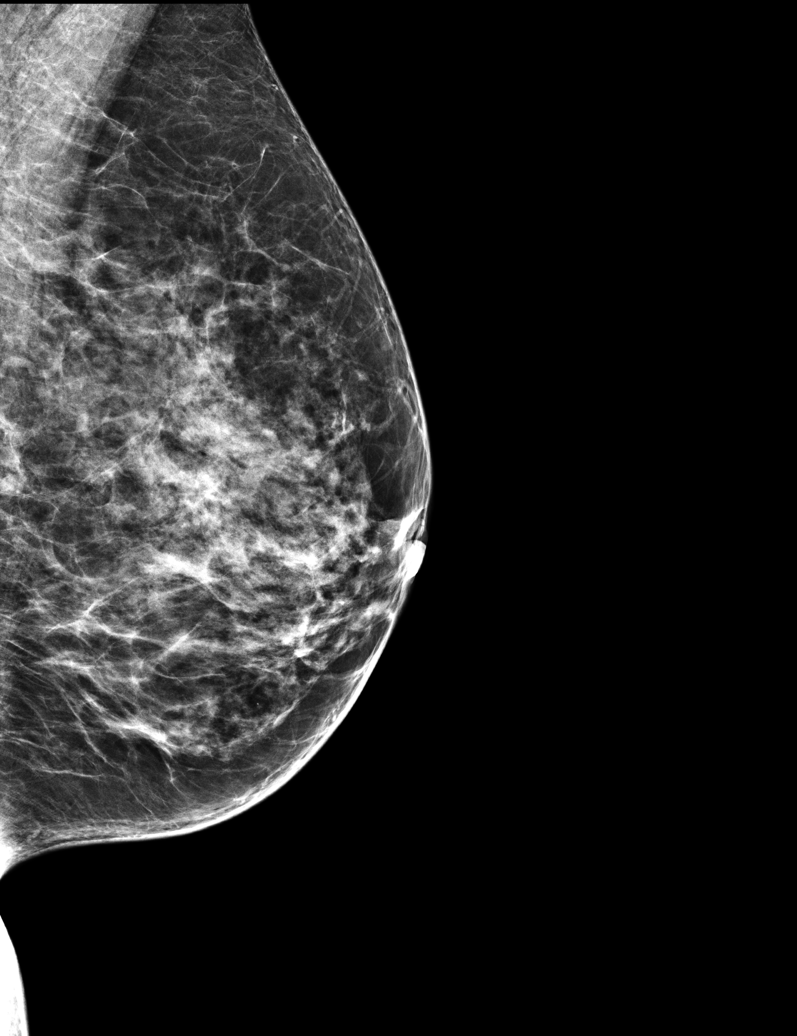

[L CC synth-2D]
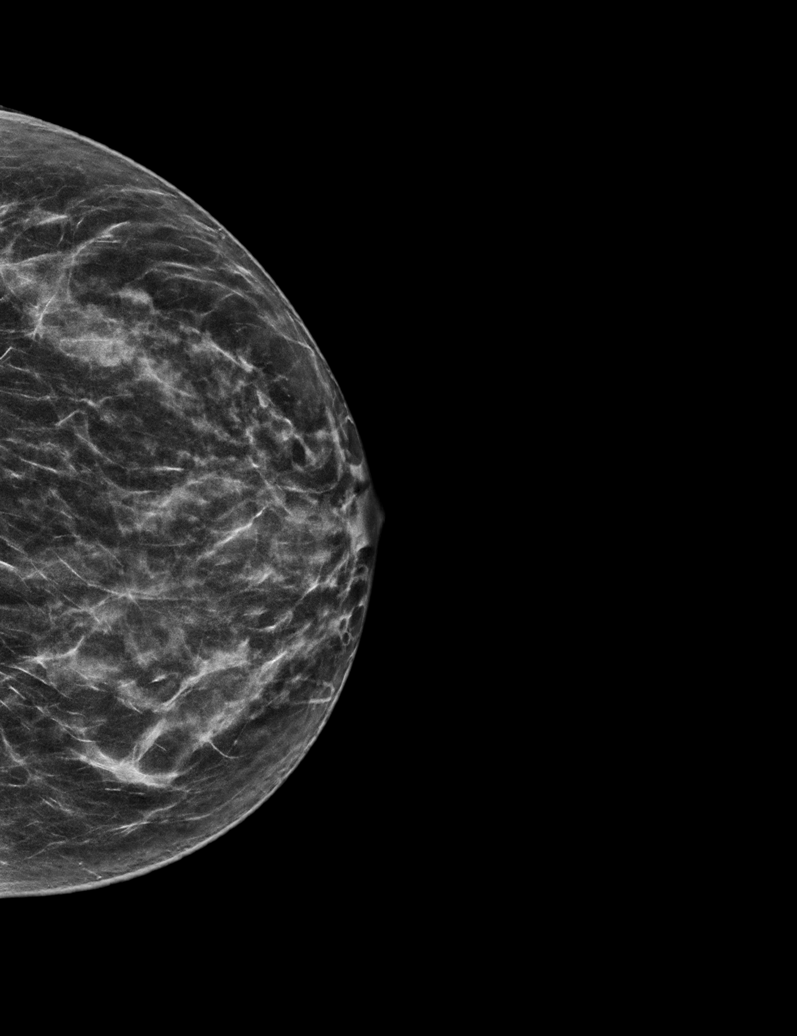

[L CC]
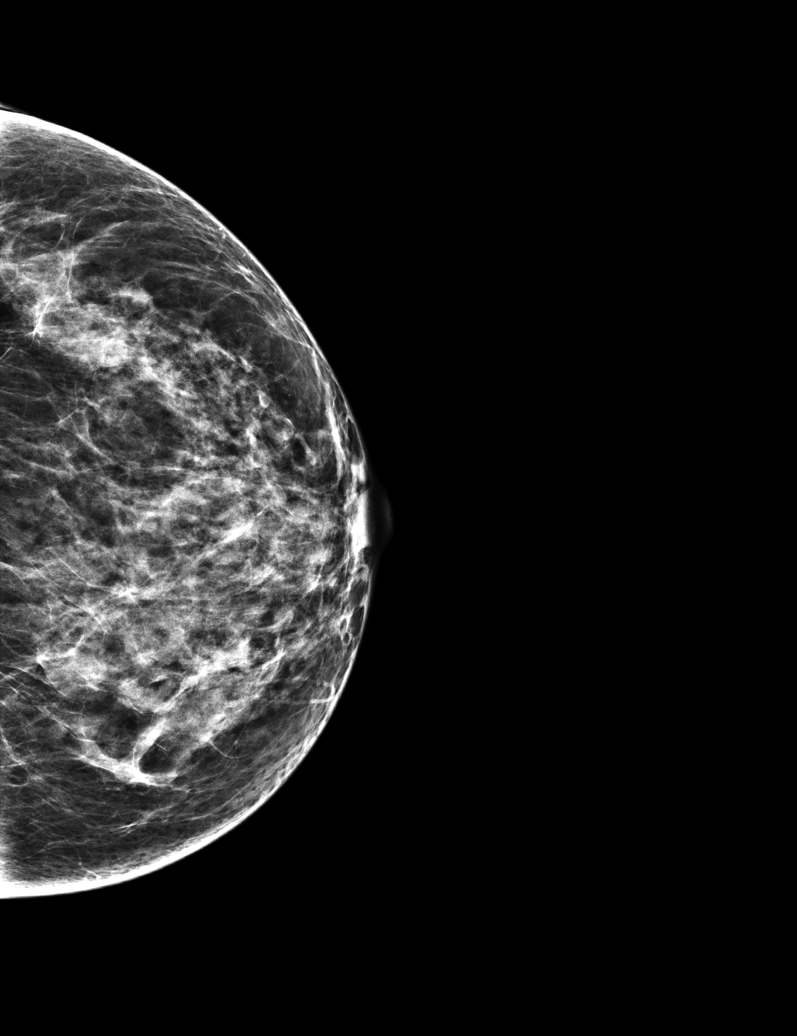

[L MLO tomo · tomo slice 26/51.0]
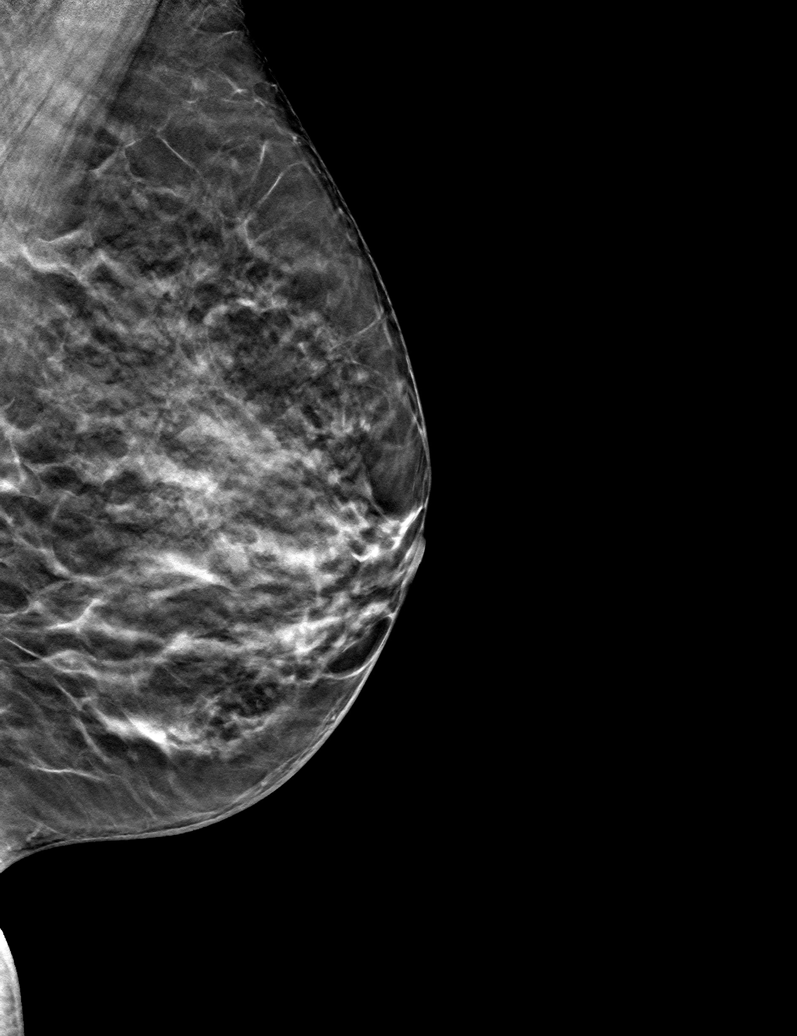

[L CC tomo · tomo slice 28/55.0]
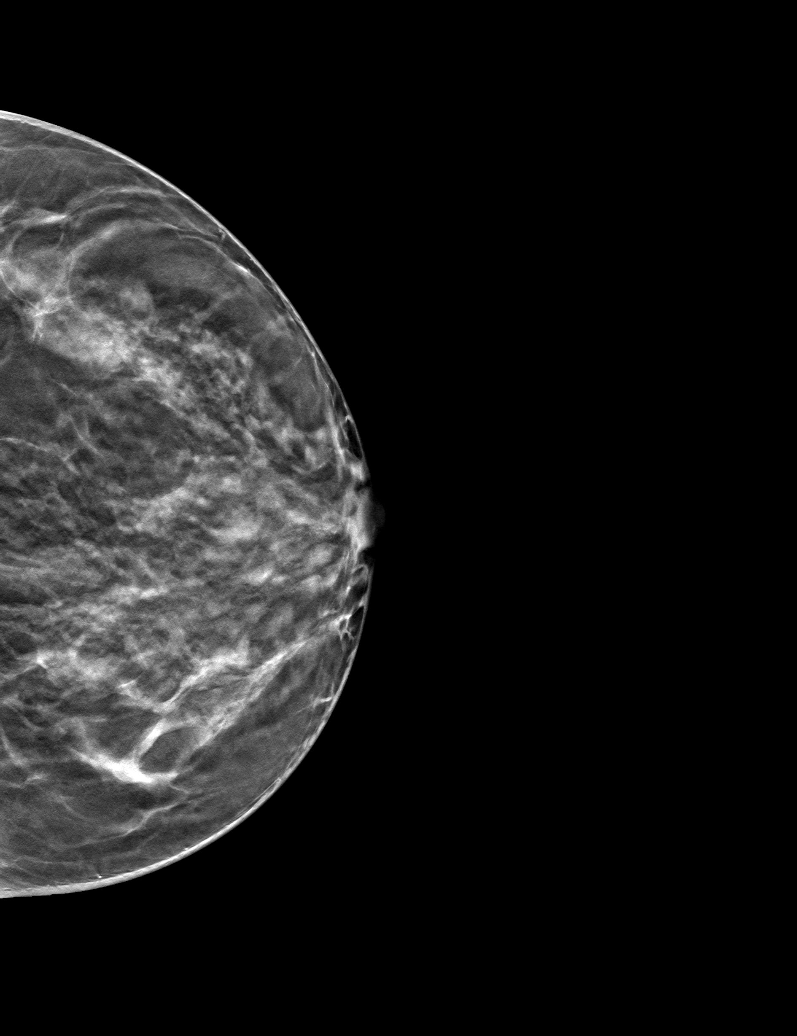

[6 of 14 positions shown; findings below may reference images not displayed]

ACR Breast Density Category c: The breast tissue is heterogeneously
dense, which may obscure small masses.
FINDINGS: On the mammogram, the left nipple does not protrude as much as it
did on prior mammograms, consistent with history provided by
patient. No mass, architectural distortion, or suspicious
microcalcification is identified in the left breast.

Mammographic images were processed with CAD.

On physical exam, the central aspect of the left nipple is inverted.
No mass is palpated in the left breast.

Targeted ultrasound is performed, showing normal fibroglandular
tissue. No solid or cystic mass or abnormal shadowing is identified
to suggest malignancy.
IMPRESSION: 45-year-old patient with six-month history of inverted left nipple.
No suspicious findings are identified on mammogram or ultrasound,
but given the new onset of nipple inversion, further evaluation with
breast MRI is recommended to exclude a mammographically occult
malignancy. Findings were discussed with the patient today. The
patient states that she lives in [HOSPITAL] and would be able to
have the breast MRI performed there.

RECOMMENDATION:
Bilateral breast MRI with and without contrast is recommended. As
the patient lives in [HOSPITAL], this could be performed at
[HOSPITAL] at [REDACTED].

I have discussed the findings and recommendations with the patient.
Results were also provided in writing at the conclusion of the
visit. If applicable, a reminder letter will be sent to the patient
regarding the next appointment.

BI-RADS CATEGORY  0: Incomplete. Need additional imaging evaluation
and/or prior mammograms for comparison.

## 2018-01-29 DIAGNOSIS — N898 Other specified noninflammatory disorders of vagina: Secondary | ICD-10-CM | POA: Diagnosis not present

## 2018-01-29 DIAGNOSIS — N760 Acute vaginitis: Secondary | ICD-10-CM | POA: Diagnosis not present

## 2018-01-29 DIAGNOSIS — R300 Dysuria: Secondary | ICD-10-CM | POA: Diagnosis not present

## 2018-03-18 DIAGNOSIS — N952 Postmenopausal atrophic vaginitis: Secondary | ICD-10-CM | POA: Diagnosis not present

## 2018-03-18 DIAGNOSIS — N941 Unspecified dyspareunia: Secondary | ICD-10-CM | POA: Diagnosis not present

## 2018-03-18 DIAGNOSIS — N959 Unspecified menopausal and perimenopausal disorder: Secondary | ICD-10-CM | POA: Diagnosis not present

## 2018-03-18 DIAGNOSIS — Z01411 Encounter for gynecological examination (general) (routine) with abnormal findings: Secondary | ICD-10-CM | POA: Diagnosis not present

## 2018-06-09 DIAGNOSIS — Z0000 Encounter for general adult medical examination without abnormal findings: Secondary | ICD-10-CM | POA: Diagnosis not present

## 2018-06-09 DIAGNOSIS — E559 Vitamin D deficiency, unspecified: Secondary | ICD-10-CM | POA: Diagnosis not present

## 2018-06-09 DIAGNOSIS — D509 Iron deficiency anemia, unspecified: Secondary | ICD-10-CM | POA: Diagnosis not present

## 2018-07-20 ENCOUNTER — Other Ambulatory Visit: Payer: Self-pay | Admitting: Obstetrics & Gynecology

## 2018-07-20 DIAGNOSIS — Z1231 Encounter for screening mammogram for malignant neoplasm of breast: Secondary | ICD-10-CM

## 2018-07-27 DIAGNOSIS — N941 Unspecified dyspareunia: Secondary | ICD-10-CM | POA: Diagnosis not present

## 2018-07-27 DIAGNOSIS — N951 Menopausal and female climacteric states: Secondary | ICD-10-CM | POA: Diagnosis not present

## 2018-08-04 ENCOUNTER — Ambulatory Visit
Admission: RE | Admit: 2018-08-04 | Discharge: 2018-08-04 | Disposition: A | Discharge status: Home / Self Care | Payer: 59 | Source: Ambulatory Visit | Attending: Obstetrics & Gynecology | Admitting: Obstetrics & Gynecology

## 2018-08-04 DIAGNOSIS — Z1231 Encounter for screening mammogram for malignant neoplasm of breast: Secondary | ICD-10-CM | POA: Diagnosis not present

## 2018-11-20 ENCOUNTER — Encounter: Payer: Self-pay | Admitting: Emergency Medicine

## 2018-11-20 ENCOUNTER — Other Ambulatory Visit: Payer: Self-pay

## 2018-11-20 ENCOUNTER — Emergency Department: Payer: 59

## 2018-11-20 ENCOUNTER — Emergency Department
Admission: EM | Admit: 2018-11-20 | Discharge: 2018-11-20 | Disposition: A | Discharge status: Home / Self Care | Payer: 59 | Attending: Emergency Medicine | Admitting: Emergency Medicine

## 2018-11-20 DIAGNOSIS — M940 Chondrocostal junction syndrome [Tietze]: Secondary | ICD-10-CM | POA: Diagnosis not present

## 2018-11-20 DIAGNOSIS — R079 Chest pain, unspecified: Secondary | ICD-10-CM | POA: Diagnosis not present

## 2018-11-20 DIAGNOSIS — R071 Chest pain on breathing: Secondary | ICD-10-CM | POA: Diagnosis not present

## 2018-11-20 LAB — CBC
HCT: 32.6 % — ABNORMAL LOW (ref 36.0–46.0)
Hemoglobin: 10.7 g/dL — ABNORMAL LOW (ref 12.0–15.0)
MCH: 27.6 pg (ref 26.0–34.0)
MCHC: 32.8 g/dL (ref 30.0–36.0)
MCV: 84.2 fL (ref 80.0–100.0)
NRBC: 0 % (ref 0.0–0.2)
PLATELETS: 263 10*3/uL (ref 150–400)
RBC: 3.87 MIL/uL (ref 3.87–5.11)
RDW: 13.5 % (ref 11.5–15.5)
WBC: 4.4 10*3/uL (ref 4.0–10.5)

## 2018-11-20 LAB — BASIC METABOLIC PANEL
ANION GAP: 7 (ref 5–15)
BUN: 11 mg/dL (ref 6–20)
CALCIUM: 9.1 mg/dL (ref 8.9–10.3)
CO2: 27 mmol/L (ref 22–32)
Chloride: 108 mmol/L (ref 98–111)
Creatinine, Ser: 0.55 mg/dL (ref 0.44–1.00)
GLUCOSE: 84 mg/dL (ref 70–99)
POTASSIUM: 3.3 mmol/L — AB (ref 3.5–5.1)
SODIUM: 142 mmol/L (ref 135–145)

## 2018-11-20 LAB — TROPONIN I: Troponin I: <0.03 ng/mL (ref <0.03)

## 2018-11-20 LAB — POCT PREGNANCY, URINE: Preg Test, Ur: NEGATIVE

## 2018-11-20 LAB — FIBRIN DERIVATIVES D-DIMER (ARMC ONLY): FIBRIN DERIVATIVES D-DIMER (ARMC): 217.86 ng{FEU}/mL (ref 0.00–499.00)

## 2018-11-20 MED ORDER — IBUPROFEN 600 MG PO TABS
600.0000 mg | ORAL_TABLET | Freq: Once | ORAL | Status: AC
  Administered 2018-11-20: 600 mg via ORAL
  Filled 2018-11-20: qty 1

## 2018-11-20 MED ORDER — IBUPROFEN 600 MG PO TABS: 600.0000 mg | ORAL_TABLET | Freq: Four times a day (QID) | ORAL | 0 refills | Status: DC | PRN

## 2018-11-20 NOTE — Discharge Instructions (Signed)
Take ibuprofen 600 mg 3 times a day with meals.  Follow-up with your primary care doctor in 2 days.  Return to the emergency room for new or worsening chest pain, shortness of breath, fever, or dizziness.

## 2018-11-20 NOTE — ED Provider Notes (Signed)
[facility] Emergency Department Provider Note  ____________________________________________  Time seen: Approximately 3:16 PM  I have reviewed the triage vital signs and the nursing notes.   HISTORY  Chief Complaint Chest Pain   HPI Angie Carroll is a 47 y.o. female no significant past medical history who presents for evaluation of chest pain.  Patient reports that the pain has been present for a month and a half.  Initially the pain was improving however over the last 2 weeks the pain has become more pronounced.  The pain is constant for 2 weeks, worse with inspiration and movement of her left shoulder.  The pain is located in the same left upper chest area, the pain is reproducible on palpation.  Patient works as an Optometrist.  Denies any trauma preceding the beginning of the pain.  No shortness of breath but sometimes the pain gets worse and makes it a little harder for her to breathe.  She reports that yesterday the pain was more severe and worse with deep inspiration.  Currently pain is 6 out of 10.  No personal or family history of blood clots, recent travel immobilization, leg pain or swelling, hemoptysis, or history of cancer.  Patient does use vaginal estrogen but no systemic exogenous hormones. No viral syndrome.  Past Medical History:  Diagnosis Date  . Papilloma of left breast 08/03/2017    Patient Active Problem List   Diagnosis Date Noted  . Papilloma of left breast 08/03/2017    Past Surgical History:  Procedure Laterality Date  . BREAST BIOPSY Left 07/02/2017   INTRADUCTAL PAPILLOMA,  . BREAST EXCISIONAL BIOPSY Right 2014   NEG  . BREAST EXCISIONAL BIOPSY Left 08/03/2017   papilloma excisied  . BREAST LUMPECTOMY WITH RADIOACTIVE SEED LOCALIZATION Left 08/03/2017   Procedure: LEFT BREAST LUMPECTOMY WITH RADIOACTIVE SEED LOCALIZATION;  Surgeon: Fanny Skates, MD;  Location: [facility];  Service: General;  Laterality:  Left;  . CESAREAN SECTION     2 csections  . CORNEAL TRANSPLANT     2 transplants    Prior to Admission medications   Medication Sig Start Date End Date Taking? Authorizing Provider  HYDROcodone-acetaminophen (NORCO) 5-325 MG tablet Take 1-2 tablets by mouth every 6 (six) hours as needed for moderate pain or severe pain. 08/03/17   Fanny Skates, MD  ibuprofen (ADVIL,MOTRIN) 600 MG tablet Take 1 tablet (600 mg total) by mouth every 6 (six) hours as needed. 11/20/18   Rudene Re, MD    Allergies Patient has no known allergies.  Family History  Problem Relation Age of Onset  . Breast cancer Sister 40       Stage 0    Social History Social History   Tobacco Use  . Smoking status: Never Smoker  . Smokeless tobacco: Never Used  Substance Use Topics  . Alcohol use: No  . Drug use: No    Review of Systems  Constitutional: Negative for fever. Eyes: Negative for visual changes. ENT: Negative for sore throat. Neck: No neck pain  Cardiovascular: + chest pain. Respiratory: Negative for shortness of breath. Gastrointestinal: Negative for abdominal pain, vomiting or diarrhea. Genitourinary: Negative for dysuria. Musculoskeletal: Negative for back pain. Skin: Negative for rash. Neurological: Negative for headaches, weakness or numbness. Psych: No SI or HI  ____________________________________________   PHYSICAL EXAM:  VITAL SIGNS: ED Triage Vitals  Enc Vitals Group     BP 11/20/18 1041 (!) 115/92     Pulse Rate 11/20/18 1041 78  Resp 11/20/18 1041 16     Temp 11/20/18 1041 98 F (36.7 C)     Temp Source 11/20/18 1041 Oral     SpO2 11/20/18 1041 98 %     Weight 11/20/18 1040 130 lb (59 kg)     Height 11/20/18 1040 5\' 5"  (1.651 m)     Head Circumference --      Peak Flow --      Pain Score 11/20/18 1040 6     Pain Loc --      Pain Edu? --      Excl. in [place]? --     Constitutional: Alert and oriented. Well appearing and in no apparent  distress. HEENT:      Head: Normocephalic and atraumatic.         Eyes: Conjunctivae are normal. Sclera is non-icteric.       Mouth/Throat: Mucous membranes are moist.       Neck: Supple with no signs of meningismus. Cardiovascular: Regular rate and rhythm. No murmurs, gallops, or rubs. 2+ symmetrical distal pulses are present in all extremities. No JVD. Pain is reproducible with palpation of the costochondral region on the L chest ans also reproducible with movement of the L arm. Respiratory: Normal respiratory effort. Lungs are clear to auscultation bilaterally. No wheezes, crackles, or rhonchi.  Gastrointestinal: Soft, non tender, and non distended with positive bowel sounds. No rebound or guarding. Musculoskeletal: Nontender with normal range of motion in all extremities. No edema, cyanosis, or erythema of extremities. Neurologic: Normal speech and language. Face is symmetric. Moving all extremities. No gross focal neurologic deficits are appreciated. Skin: Skin is warm, dry and intact. No rash noted. Psychiatric: Mood and affect are normal. Speech and behavior are normal.  ____________________________________________   LABS (all labs ordered are listed, but only abnormal results are displayed)  Labs Reviewed  BASIC METABOLIC PANEL - Abnormal; Notable for the following components:      Result Value   Potassium 3.3 (*)    All other components within normal limits  CBC - Abnormal; Notable for the following components:   Hemoglobin 10.7 (*)    HCT 32.6 (*)    All other components within normal limits  TROPONIN I  TROPONIN I  FIBRIN DERIVATIVES D-DIMER ([facility] ONLY)  I-STAT BETA HCG BLOOD, ED ([facility], [facility], [facility] ONLY)   ____________________________________________  EKG  ED ECG REPORT I, Rudene Re, the attending physician, personally viewed and interpreted this ECG.  Normal sinus rhythm, rate of 65, normal intervals, normal axis, no ST elevations or depressions, low voltage QRS.   Unchanged from prior from 2014 ____________________________________________  RADIOLOGY  I have personally reviewed the images performed during this visit and I agree with the Radiologist's read.   Interpretation by Radiologist:  Dg Chest 2 View  Result Date: 11/20/2018 CLINICAL DATA:  Chest pain. EXAM: CHEST - 2 VIEW COMPARISON:  06/07/2013 FINDINGS: The heart size and mediastinal contours are within normal limits. Both lungs are clear. The visualized skeletal structures are unremarkable. Surgical clips in the left breast. IMPRESSION: No active cardiopulmonary disease. Electronically Signed   By: Lorriane Shire M.D.   On: 11/20/2018 11:54     ____________________________________________   PROCEDURES  Procedure(s) performed: None Procedures Critical Care performed:  None ____________________________________________   INITIAL IMPRESSION / ASSESSMENT AND PLAN / ED COURSE  47 y.o. female no significant past medical history who presents for evaluation of chest pain x 6 weeks constant, reproducible on palpation of the left costochondral region  and with movement of the left shoulder.  Differential diagnoses including costochondritis versus pleurisy versus MSK.  Chest x-ray no evidence of pneumothorax, pulmonary edema, or pneumonia.  Since patient is on topical exogenous hormones and the pain is pleuritic in nature we will send a d-dimer although low suspicion for PE.  EKG with no ischemic changes.  Troponin negative.  Will start patient on ibuprofen.    _________________________ 3:50 PM on 11/20/2018 -----------------------------------------  D-dimer negative.  Will discharge patient home on NSAIDs for costochondritis with follow-up with primary care doctor.  Discussed standard return precautions.   As part of my medical decision making, I reviewed the following data within the [facility] notes reviewed and incorporated, Labs reviewed , EKG interpreted , Old EKG  reviewed, Radiograph reviewed , Notes from prior ED visits and [place] Controlled Substance Database    Pertinent labs & imaging results that were available during my care of the patient were reviewed by me and considered in my medical decision making (see chart for details).    ____________________________________________   FINAL CLINICAL IMPRESSION(S) / ED DIAGNOSES  Final diagnoses:  Costochondritis      NEW MEDICATIONS STARTED DURING THIS VISIT:  ED Discharge Orders         Ordered    ibuprofen (ADVIL,MOTRIN) 600 MG tablet  Every 6 hours PRN     11/20/18 1549           Note:  This document was prepared using Dragon voice recognition software and may include unintentional dictation errors.    Rudene Re, MD 11/20/18 [phone]

## 2018-11-20 NOTE — ED Triage Notes (Signed)
Pt to ED via [facility] from home for chest pain x 1.5 months. Pt states that for the past 2 weeks pain has been consistent and she has been having shortness of breath and left arm pain. Pt states that she came to be seen 3 weeks ago but the wait was long so she did not stay. Pt states that nothing has changed that made her call today. Pt is able to talk in complete sentences at this time. Does not appear to be in any distress.

## 2018-11-24 HISTORY — PX: COLONOSCOPY: SHX174

## 2019-05-04 ENCOUNTER — Other Ambulatory Visit: Payer: Self-pay | Admitting: *Deleted

## 2019-05-04 DIAGNOSIS — Z20822 Contact with and (suspected) exposure to covid-19: Secondary | ICD-10-CM

## 2019-05-04 NOTE — Progress Notes (Signed)
[id number]

## 2019-05-05 LAB — NOVEL CORONAVIRUS, NAA: SARS-CoV-2, NAA: NOT DETECTED

## 2019-05-20 ENCOUNTER — Ambulatory Visit: Payer: 59

## 2019-05-20 ENCOUNTER — Other Ambulatory Visit: Payer: Self-pay

## 2019-05-20 ENCOUNTER — Other Ambulatory Visit: Payer: Self-pay | Admitting: Family Medicine

## 2019-05-20 ENCOUNTER — Encounter (INDEPENDENT_AMBULATORY_CARE_PROVIDER_SITE_OTHER): Payer: Self-pay

## 2019-05-20 ENCOUNTER — Ambulatory Visit
Admission: RE | Admit: 2019-05-20 | Discharge: 2019-05-20 | Disposition: A | Discharge status: Home / Self Care | Payer: 59 | Source: Ambulatory Visit | Attending: Family Medicine | Admitting: Family Medicine

## 2019-05-20 DIAGNOSIS — M79605 Pain in left leg: Secondary | ICD-10-CM | POA: Insufficient documentation

## 2019-05-20 DIAGNOSIS — M79604 Pain in right leg: Secondary | ICD-10-CM

## 2019-06-28 ENCOUNTER — Other Ambulatory Visit: Payer: Self-pay | Admitting: Obstetrics & Gynecology

## 2019-06-29 ENCOUNTER — Other Ambulatory Visit: Payer: Self-pay | Admitting: Family Medicine

## 2019-06-29 DIAGNOSIS — Z1231 Encounter for screening mammogram for malignant neoplasm of breast: Secondary | ICD-10-CM

## 2019-08-17 ENCOUNTER — Ambulatory Visit
Admission: RE | Admit: 2019-08-17 | Discharge: 2019-08-17 | Disposition: A | Discharge status: Home / Self Care | Payer: 59 | Source: Ambulatory Visit | Attending: Family Medicine | Admitting: Family Medicine

## 2019-08-17 ENCOUNTER — Other Ambulatory Visit: Payer: Self-pay

## 2019-08-17 DIAGNOSIS — Z1231 Encounter for screening mammogram for malignant neoplasm of breast: Secondary | ICD-10-CM | POA: Diagnosis present

## 2019-08-17 DIAGNOSIS — Z20822 Contact with and (suspected) exposure to covid-19: Secondary | ICD-10-CM

## 2019-08-19 LAB — NOVEL CORONAVIRUS, NAA: SARS-CoV-2, NAA: NOT DETECTED

## 2020-01-15 ENCOUNTER — Other Ambulatory Visit: Payer: Self-pay

## 2020-01-15 DIAGNOSIS — Z20822 Contact with and (suspected) exposure to covid-19: Secondary | ICD-10-CM

## 2020-01-16 LAB — NOVEL CORONAVIRUS, NAA: SARS-CoV-2, NAA: NOT DETECTED

## 2020-09-19 ENCOUNTER — Other Ambulatory Visit (HOSPITAL_COMMUNITY): Payer: Self-pay | Admitting: Physician Assistant

## 2020-09-19 ENCOUNTER — Ambulatory Visit (HOSPITAL_COMMUNITY)
Admission: RE | Admit: 2020-09-19 | Discharge: 2020-09-19 | Disposition: A | Discharge status: Home / Self Care | Payer: 59 | Source: Ambulatory Visit | Attending: Cardiovascular Disease | Admitting: Cardiovascular Disease

## 2020-09-19 ENCOUNTER — Other Ambulatory Visit: Payer: Self-pay

## 2020-09-19 DIAGNOSIS — M79605 Pain in left leg: Secondary | ICD-10-CM | POA: Insufficient documentation

## 2020-10-01 ENCOUNTER — Other Ambulatory Visit: Payer: Self-pay | Admitting: Family Medicine

## 2020-10-01 DIAGNOSIS — Z1231 Encounter for screening mammogram for malignant neoplasm of breast: Secondary | ICD-10-CM

## 2020-11-09 ENCOUNTER — Ambulatory Visit
Admission: RE | Admit: 2020-11-09 | Discharge: 2020-11-09 | Disposition: A | Discharge status: Home / Self Care | Payer: 59 | Source: Ambulatory Visit | Attending: Family Medicine | Admitting: Family Medicine

## 2020-11-09 ENCOUNTER — Other Ambulatory Visit: Payer: Self-pay

## 2020-11-09 DIAGNOSIS — Z1231 Encounter for screening mammogram for malignant neoplasm of breast: Secondary | ICD-10-CM | POA: Diagnosis not present

## 2021-10-29 ENCOUNTER — Other Ambulatory Visit: Payer: Self-pay | Admitting: Family Medicine

## 2021-10-29 DIAGNOSIS — Z1231 Encounter for screening mammogram for malignant neoplasm of breast: Secondary | ICD-10-CM

## 2021-11-13 ENCOUNTER — Other Ambulatory Visit: Payer: Self-pay

## 2021-11-13 ENCOUNTER — Ambulatory Visit
Admission: RE | Admit: 2021-11-13 | Discharge: 2021-11-13 | Disposition: A | Discharge status: Home / Self Care | Payer: 59 | Source: Ambulatory Visit | Attending: Family Medicine | Admitting: Family Medicine

## 2021-11-13 DIAGNOSIS — Z1231 Encounter for screening mammogram for malignant neoplasm of breast: Secondary | ICD-10-CM | POA: Diagnosis not present

## 2022-03-18 ENCOUNTER — Other Ambulatory Visit: Payer: Self-pay | Admitting: Obstetrics and Gynecology

## 2022-03-18 ENCOUNTER — Other Ambulatory Visit (HOSPITAL_COMMUNITY)
Admission: RE | Admit: 2022-03-18 | Discharge: 2022-03-18 | Disposition: A | Discharge status: Home / Self Care | Payer: 59 | Source: Ambulatory Visit | Attending: Obstetrics and Gynecology | Admitting: Obstetrics and Gynecology

## 2022-03-18 DIAGNOSIS — Z01419 Encounter for gynecological examination (general) (routine) without abnormal findings: Secondary | ICD-10-CM | POA: Diagnosis not present

## 2022-03-20 LAB — CYTOLOGY - PAP
Comment: NEGATIVE
Diagnosis: NEGATIVE
High risk HPV: NEGATIVE

## 2022-10-13 ENCOUNTER — Encounter: Payer: Self-pay | Admitting: Family Medicine

## 2022-10-13 DIAGNOSIS — Z1231 Encounter for screening mammogram for malignant neoplasm of breast: Secondary | ICD-10-CM

## 2022-11-03 DIAGNOSIS — Z0000 Encounter for general adult medical examination without abnormal findings: Secondary | ICD-10-CM | POA: Diagnosis not present

## 2022-11-03 DIAGNOSIS — D509 Iron deficiency anemia, unspecified: Secondary | ICD-10-CM | POA: Diagnosis not present

## 2022-11-03 DIAGNOSIS — N898 Other specified noninflammatory disorders of vagina: Secondary | ICD-10-CM | POA: Diagnosis not present

## 2022-11-03 DIAGNOSIS — E559 Vitamin D deficiency, unspecified: Secondary | ICD-10-CM | POA: Diagnosis not present

## 2022-11-04 ENCOUNTER — Other Ambulatory Visit: Payer: Self-pay | Admitting: Family Medicine

## 2022-11-04 DIAGNOSIS — Z1231 Encounter for screening mammogram for malignant neoplasm of breast: Secondary | ICD-10-CM

## 2022-11-04 DIAGNOSIS — N644 Mastodynia: Secondary | ICD-10-CM

## 2022-11-10 ENCOUNTER — Telehealth: Payer: Self-pay | Admitting: Hematology and Oncology

## 2022-11-10 NOTE — Telephone Encounter (Signed)
Scheduled appointment per referral. Patient is aware of appointment date and time. Patient is aware to arrive 15 mins prior to appointment time and to bring updated insurance cards. Patient is aware of location.   

## 2022-11-15 ENCOUNTER — Inpatient Hospital Stay: Payer: BC Managed Care – PPO

## 2022-11-15 ENCOUNTER — Inpatient Hospital Stay: Payer: BC Managed Care – PPO | Attending: Hematology and Oncology | Admitting: Hematology and Oncology

## 2022-11-15 DIAGNOSIS — D649 Anemia, unspecified: Secondary | ICD-10-CM | POA: Insufficient documentation

## 2022-11-15 NOTE — Assessment & Plan Note (Deleted)
Lab review 11/04/2022: Hemoglobin 10.5, MCV 85.6, RDW 14.1, WBC 4.9, platelets 284 Last colonoscopy was August 2023.  Differential diagnosis: 1. Anemia due to chronic disease and inflammation 2. anemia due to renal dysfunction 3. Combined B-12 and iron deficiency anemias 4. Hemolysis 5. Hypothyroidism 6. Plasma cell disorders myeloma 7. Bone marrow dysfunction  Workup performed: 1. CBC with differential to evaluate the smear 2. CMP to evaluate liver and kidney function 3. Haptoglobin, LDH, reticulocyte count to evaluate hemolysis 4. TSH 5. SPEP 6. Iron and T-82 and folic acid levels 7.  Erythropoietin  Return to clinic in 1 week to discuss lab results and come up with the treatment plan. If worsening anemia no clear-cut etiology then we will perform bone marrow biopsy.

## 2022-11-20 ENCOUNTER — Ambulatory Visit
Admission: RE | Admit: 2022-11-20 | Discharge: 2022-11-20 | Disposition: A | Discharge status: Home / Self Care | Payer: BC Managed Care – PPO | Source: Ambulatory Visit | Attending: Family Medicine | Admitting: Family Medicine

## 2022-11-20 DIAGNOSIS — N644 Mastodynia: Secondary | ICD-10-CM | POA: Diagnosis not present

## 2022-11-20 DIAGNOSIS — Z1231 Encounter for screening mammogram for malignant neoplasm of breast: Secondary | ICD-10-CM | POA: Insufficient documentation

## 2023-01-24 DIAGNOSIS — M461 Sacroiliitis, not elsewhere classified: Secondary | ICD-10-CM | POA: Diagnosis not present

## 2023-05-08 DIAGNOSIS — N952 Postmenopausal atrophic vaginitis: Secondary | ICD-10-CM | POA: Diagnosis not present

## 2023-05-08 DIAGNOSIS — R300 Dysuria: Secondary | ICD-10-CM | POA: Diagnosis not present

## 2023-05-08 DIAGNOSIS — Z01419 Encounter for gynecological examination (general) (routine) without abnormal findings: Secondary | ICD-10-CM | POA: Diagnosis not present

## 2023-05-08 DIAGNOSIS — N951 Menopausal and female climacteric states: Secondary | ICD-10-CM | POA: Diagnosis not present

## 2023-07-31 ENCOUNTER — Other Ambulatory Visit: Payer: Self-pay

## 2023-07-31 DIAGNOSIS — N858 Other specified noninflammatory disorders of uterus: Secondary | ICD-10-CM | POA: Diagnosis not present

## 2023-07-31 DIAGNOSIS — N950 Postmenopausal bleeding: Secondary | ICD-10-CM | POA: Diagnosis not present

## 2023-08-27 NOTE — Progress Notes (Signed)
[facility] [phone]   [phone]  INITIAL CONSULT NOTE  Patient Care Team: [name] as PCP - General (Family Medicine)  Hematological/Oncological History # Normocytic Anemia of Unclear Etiology # Sickle Cell Trait   08/26/2007: Hemoglobinopathy testing shows Sickle Cell Trait. Baseline Hgb 10-11.  07/31/2023: WBC 4.7, Hgb 9.8, MCV 85.2, Plt 285. Iron sat 22%, ferritin 76.8 08/28/2023: Establish care with Dr. Leonides Schanz  CHIEF COMPLAINTS/PURPOSE OF CONSULTATION:  "Normocytic Anemia "  HISTORY OF PRESENTING ILLNESS:  Angie Carroll 52 y.o. female with medical history significant for sickle cell trait who presents for evaluation of normocytic anemia of unclear etiology.  On review of the previous records the patient had labs collected on 08/26/2007 which showed sickle cell trait and a baseline hemoglobin approximately 10-11.  Most recently on 07/31/2023 patient had labs drawn which showed white blood cell count 4.7, hemoglobin 9.8, MCV 85.2, platelets 285.  Iron sat showed a ferritin of 76 with iron sat of 22%.  Due to concern for these findings the patient was referred to hematology for further evaluation and management.  On exam today [name] reports that she is in premenopause and was started on estrogen patch.  She reports that for 3 weeks out of the month she was having a bleeding.  She reports it was not spotting or a full cycle but somewhere in between.  She reports that it was like water for 1 whole week.  She notes that she had been taking iron pills over-the-counter and they were not causing any stomach issues such as nausea or constipation.  She reports that she took about 1 week of it.  She notes that she did not see much of a difference when taking the iron pills.  She reports that today her energy levels are "okay" but she is going to bed earlier.  She is doing her best to eat red meat approximately twice per week.  She is  not having any bleeding elsewhere such as nosebleeds, gum bleeding, or dark stools.  On further discussion the patient reports that her daughter is a patient of our clinic as well.  She notes that she has fibroids.  She reports that she is a never smoker never drinker and currently works as an Airline pilot.  She is not having any lightheadedness, dizziness, or shortness of breath.  She reports her energy today is about a 6 or 7 out of 10.  Otherwise she has no questions concerns or complaints.  A full 10 point ROS is otherwise negative.  MEDICAL HISTORY:  Past Medical History:  Diagnosis Date   Papilloma of left breast 08/03/2017    SURGICAL HISTORY: Past Surgical History:  Procedure Laterality Date   BREAST BIOPSY Left 07/02/2017   INTRADUCTAL PAPILLOMA,   BREAST EXCISIONAL BIOPSY Right 2014   NEG   BREAST EXCISIONAL BIOPSY Left 08/03/2017   papilloma excisied   BREAST LUMPECTOMY WITH RADIOACTIVE SEED LOCALIZATION Left 08/03/2017   Procedure: LEFT BREAST LUMPECTOMY WITH RADIOACTIVE SEED LOCALIZATION;  Surgeon: Claud Kelp, MD;  Location: [facility];  Service: General;  Laterality: Left;   CESAREAN SECTION     2 csections   CORNEAL TRANSPLANT     2 transplants    SOCIAL HISTORY: Social History   Socioeconomic History   Marital status: Married    Spouse name: Not on file   Number of children: Not on file   Years of education: Not on file   Highest  education level: Not on file  Occupational History   Not on file  Tobacco Use   Smoking status: Never   Smokeless tobacco: Never  Substance and Sexual Activity   Alcohol use: No   Drug use: No   Sexual activity: Yes    Birth control/protection: Other-see comments  Other Topics Concern   Not on file  Social History Narrative   Not on file   Social Determinants of Health   Financial Resource Strain: Not on file  Food Insecurity: Not on file  Transportation Needs: Not on file  Physical Activity: Not on file   Stress: Not on file  Social Connections: Not on file  Intimate Partner Violence: Not on file    FAMILY HISTORY: Family History  Problem Relation Age of Onset   Breast cancer Sister 37       Stage 0   Breast cancer Paternal Aunt    Breast cancer Paternal Aunt    Breast cancer Paternal Aunt    Breast cancer Paternal Aunt     ALLERGIES:  has No Known Allergies.  MEDICATIONS:  Current Outpatient Medications  Medication Sig Dispense Refill   HYDROcodone-acetaminophen (NORCO) 5-325 MG tablet Take 1-2 tablets by mouth every 6 (six) hours as needed for moderate pain or severe pain. 20 tablet 0   ibuprofen (ADVIL,MOTRIN) 600 MG tablet Take 1 tablet (600 mg total) by mouth every 6 (six) hours as needed. 20 tablet 0   No current facility-administered medications for this visit.    REVIEW OF SYSTEMS:   Constitutional: ( - ) fevers, ( - )  chills , ( - ) night sweats Eyes: ( - ) blurriness of vision, ( - ) double vision, ( - ) watery eyes Ears, nose, mouth, throat, and face: ( - ) mucositis, ( - ) sore throat Respiratory: ( - ) cough, ( - ) dyspnea, ( - ) wheezes Cardiovascular: ( - ) palpitation, ( - ) chest discomfort, ( - ) lower extremity swelling Gastrointestinal:  ( - ) nausea, ( - ) heartburn, ( - ) change in bowel habits Skin: ( - ) abnormal skin rashes Lymphatics: ( - ) new lymphadenopathy, ( - ) easy bruising Neurological: ( - ) numbness, ( - ) tingling, ( - ) new weaknesses Behavioral/Psych: ( - ) mood change, ( - ) new changes  All other systems were reviewed with the patient and are negative.  PHYSICAL EXAMINATION:  There were no vitals filed for this visit. There were no vitals filed for this visit.  GENERAL: well appearing well-appearing middle-aged African-American female in NAD  SKIN: skin color, texture, turgor are normal, no rashes or significant lesions EYES: conjunctiva are pink and non-injected, sclera clear LUNGS: clear to auscultation and percussion with  normal breathing effort HEART: regular rate & rhythm and no murmurs and no lower extremity edema Musculoskeletal: no cyanosis of digits and no clubbing  PSYCH: alert & oriented x 3, fluent speech NEURO: no focal motor/sensory deficits  LABORATORY DATA:  I have reviewed the data as listed    Latest Ref Rng & Units 11/20/2018   10:43 AM 06/07/2013    1:26 PM 02/28/2012   11:37 AM  CBC  WBC 4.0 - 10.5 K/uL 4.4  6.4  4.4   Hemoglobin 12.0 - 15.0 g/dL 78.2  9.7  5.9   Hematocrit 36.0 - 46.0 % 32.6  30.9  20.4   Platelets 150 - 400 K/uL 263  338  494  Latest Ref Rng & Units 11/20/2018   10:43 AM 06/07/2013    1:26 PM 08/26/2007    1:43 PM  CMP  Glucose 70 - 99 mg/dL 84  161  77   BUN 6 - 20 mg/dL 11  9  10    Creatinine 0.44 - 1.00 mg/dL 0.96  0.45  4.09   Sodium 135 - 145 mmol/L 142  140  140   Potassium 3.5 - 5.1 mmol/L 3.3  3.1  4.0   Chloride 98 - 111 mmol/L 108  105  107   CO2 22 - 32 mmol/L 27  31  26    Calcium 8.9 - 10.3 mg/dL 9.1  8.7  9.0   Total Protein 6.4 - 8.2 g/dL  7.7  6.7   Total Bilirubin 0.2 - 1.0 mg/dL  0.4  0.6   Alkaline Phos 50 - 136 Unit/L  131  32   AST 15 - 37 Unit/L  68  13   ALT 12 - 78 U/L  113  <8      ASSESSMENT & PLAN Gabbrielle Royetta Asal 52 y.o. female with medical history significant for sickle cell trait who presents for evaluation of normocytic anemia of unclear etiology.  After review of the labs, review of the records, and discussion with the patient the patients findings are most consistent with normocytic anemia of unclear etiology.  # Normocytic Anemia # Sickle Cell Trait  -- Will order full nutritional studies to include iron panel, ferritin, vitamin B12, folate, and methylmalonic acid. -- Will order hemolysis labs with LDH and reticulocytes.  If evidence of hemolysis we will also order haptoglobin and DAT -- Rule out multiple myeloma with SPEP and serum free light chains -- If no clear etiology can be found we will need to pursue a  bone marrow biopsy. -- Return to clinic pending the results of the above studies.  No orders of the defined types were placed in this encounter.  All questions were answered. The patient knows to call the clinic with any problems, questions or concerns.  A total of more than 60 minutes were spent on this encounter with face-to-face time and non-face-to-face time, including preparing to see the patient, ordering tests and/or medications, counseling the patient and coordination of care as outlined above.   Ulysees Barns, MD Department of Hematology/Oncology [facility] Phone: [phone] Pager: [phone] Email: [email or web address]  08/27/2023 4:17 PM

## 2023-08-28 ENCOUNTER — Inpatient Hospital Stay: Payer: BC Managed Care – PPO | Attending: Hematology and Oncology | Admitting: Hematology and Oncology

## 2023-08-28 ENCOUNTER — Inpatient Hospital Stay: Payer: BC Managed Care – PPO

## 2023-08-28 VITALS — BP 112/87 | HR 79 | Temp 98.0°F | Resp 17 | Ht 65.0 in | Wt 138.3 lb

## 2023-08-28 DIAGNOSIS — D649 Anemia, unspecified: Secondary | ICD-10-CM | POA: Insufficient documentation

## 2023-08-28 DIAGNOSIS — D573 Sickle-cell trait: Secondary | ICD-10-CM | POA: Insufficient documentation

## 2023-08-28 LAB — CBC WITH DIFFERENTIAL (CANCER CENTER ONLY)
Abs Immature Granulocytes: 0.01 K/uL (ref 0.00–0.07)
Basophils Absolute: 0.1 K/uL (ref 0.0–0.1)
Basophils Relative: 1 %
Eosinophils Absolute: 0.1 K/uL (ref 0.0–0.5)
Eosinophils Relative: 3 %
HCT: 34.5 % — ABNORMAL LOW (ref 36.0–46.0)
Hemoglobin: 11.1 g/dL — ABNORMAL LOW (ref 12.0–15.0)
Immature Granulocytes: 0 %
Lymphocytes Relative: 45 %
Lymphs Abs: 1.9 K/uL (ref 0.7–4.0)
MCH: 27.4 pg (ref 26.0–34.0)
MCHC: 32.2 g/dL (ref 30.0–36.0)
MCV: 85.2 fL (ref 80.0–100.0)
Monocytes Absolute: 0.3 K/uL (ref 0.1–1.0)
Monocytes Relative: 6 %
Neutro Abs: 1.9 K/uL (ref 1.7–7.7)
Neutrophils Relative %: 45 %
Platelet Count: 294 K/uL (ref 150–400)
RBC: 4.05 MIL/uL (ref 3.87–5.11)
RDW: 13.5 % (ref 11.5–15.5)
WBC Count: 4.2 K/uL (ref 4.0–10.5)
nRBC: 0 % (ref 0.0–0.2)

## 2023-08-28 LAB — CMP (CANCER CENTER ONLY)
ALT: 20 U/L (ref 0–44)
AST: 21 U/L (ref 15–41)
Albumin: 4.5 g/dL (ref 3.5–5.0)
Alkaline Phosphatase: 47 U/L (ref 38–126)
Anion gap: 7 (ref 5–15)
BUN: 12 mg/dL (ref 6–20)
CO2: 28 mmol/L (ref 22–32)
Calcium: 9.7 mg/dL (ref 8.9–10.3)
Chloride: 106 mmol/L (ref 98–111)
Creatinine: 0.85 mg/dL (ref 0.44–1.00)
GFR, Estimated: >60 mL/min (ref >60)
Glucose, Bld: 87 mg/dL (ref 70–99)
Potassium: 4.3 mmol/L (ref 3.5–5.1)
Sodium: 141 mmol/L (ref 135–145)
Total Bilirubin: 0.9 mg/dL (ref 0.3–1.2)
Total Protein: 7.9 g/dL (ref 6.5–8.1)

## 2023-08-28 LAB — IRON AND IRON BINDING CAPACITY (CC-WL,HP ONLY)
Iron: 71 ug/dL (ref 28–170)
Saturation Ratios: 21 % (ref 10.4–31.8)
TIBC: 339 ug/dL (ref 250–450)
UIBC: 268 ug/dL (ref 148–442)

## 2023-08-28 LAB — LACTATE DEHYDROGENASE: LDH: 148 U/L (ref 98–192)

## 2023-08-28 LAB — RETIC PANEL
Immature Retic Fract: 13.3 % (ref 2.3–15.9)
RBC.: 4.02 MIL/uL (ref 3.87–5.11)
Retic Count, Absolute: 37 K/uL (ref 19.0–186.0)
Retic Ct Pct: 0.9 % (ref 0.4–3.1)
Reticulocyte Hemoglobin: 31.4 pg

## 2023-08-28 LAB — FERRITIN: Ferritin: 124 ng/mL (ref 11–307)

## 2023-08-28 LAB — VITAMIN B12: Vitamin B-12: 322 pg/mL (ref 180–914)

## 2023-08-28 LAB — FOLATE: Folate: 21.6 ng/mL

## 2023-08-31 DIAGNOSIS — D219 Benign neoplasm of connective and other soft tissue, unspecified: Secondary | ICD-10-CM | POA: Diagnosis not present

## 2023-08-31 DIAGNOSIS — N950 Postmenopausal bleeding: Secondary | ICD-10-CM | POA: Diagnosis not present

## 2023-08-31 LAB — KAPPA/LAMBDA LIGHT CHAINS
Kappa free light chain: 22.5 mg/L — ABNORMAL HIGH (ref 3.3–19.4)
Kappa, lambda light chain ratio: 1.54 (ref 0.26–1.65)
Lambda free light chains: 14.6 mg/L (ref 5.7–26.3)

## 2023-09-01 LAB — MULTIPLE MYELOMA PANEL, SERUM
Albumin SerPl Elph-Mcnc: 4.1 g/dL (ref 2.9–4.4)
Albumin/Glob SerPl: 1.2 (ref 0.7–1.7)
Alpha 1: 0.3 g/dL (ref 0.0–0.4)
Alpha2 Glob SerPl Elph-Mcnc: 0.7 g/dL (ref 0.4–1.0)
B-Globulin SerPl Elph-Mcnc: 1.1 g/dL (ref 0.7–1.3)
Gamma Glob SerPl Elph-Mcnc: 1.4 g/dL (ref 0.4–1.8)
Globulin, Total: 3.5 g/dL (ref 2.2–3.9)
IgA: 266 mg/dL (ref 87–352)
IgG (Immunoglobin G), Serum: 1553 mg/dL (ref 586–1602)
IgM (Immunoglobulin M), Srm: 162 mg/dL (ref 26–217)
Total Protein ELP: 7.6 g/dL (ref 6.0–8.5)

## 2023-09-01 LAB — COPPER, SERUM: Copper: 103 ug/dL (ref 80–158)

## 2023-09-03 LAB — METHYLMALONIC ACID, SERUM: Methylmalonic Acid, Quantitative: 92 nmol/L (ref 0–378)

## 2023-09-08 ENCOUNTER — Telehealth: Payer: Self-pay

## 2023-09-08 NOTE — Telephone Encounter (Signed)
-----   Message from Ulysees Barns IV sent at 09/08/2023  1:37 PM EDT ----- Please let Ms. Osorno know that her labs look good.  Her hemoglobin has rebounded to 11.1.  This is approximately her baseline with sickle cell trait.  We do not find any evidence of nutritional deficiencies or other concerning abnormalities in the blood.  Your is no need for routine follow-up in our clinic unless she were to have new or worsening cytopenias. ----- Message ----- From: Leory Plowman, Lab In [place] Sent: 08/28/2023  10:13 AM EDT To: Jaci Standard, MD

## 2023-09-08 NOTE — Telephone Encounter (Signed)
This RN attempted to reach pt to discuss below lab results- LVM with call back number [phone].

## 2023-09-10 ENCOUNTER — Telehealth: Payer: Self-pay

## 2023-09-10 NOTE — Telephone Encounter (Signed)
-----   Message from Ulysees Barns IV sent at 09/08/2023  1:37 PM EDT ----- Please let Ms. Osorno know that her labs look good.  Her hemoglobin has rebounded to 11.1.  This is approximately her baseline with sickle cell trait.  We do not find any evidence of nutritional deficiencies or other concerning abnormalities in the blood.  Your is no need for routine follow-up in our clinic unless she were to have new or worsening cytopenias. ----- Message ----- From: Leory Plowman, Lab In [place] Sent: 08/28/2023  10:13 AM EDT To: Jaci Standard, MD

## 2023-09-10 NOTE — Telephone Encounter (Signed)
The below results were relayed to pt. Pt informed to please call for follow up if any new concerns appear. Pt verbalized understanding.

## 2024-01-04 ENCOUNTER — Other Ambulatory Visit: Payer: Self-pay | Admitting: Family Medicine

## 2024-01-04 DIAGNOSIS — Z1231 Encounter for screening mammogram for malignant neoplasm of breast: Secondary | ICD-10-CM

## 2024-01-19 ENCOUNTER — Ambulatory Visit
Admission: RE | Admit: 2024-01-19 | Discharge: 2024-01-19 | Disposition: A | Discharge status: Home / Self Care | Payer: BC Managed Care – PPO | Source: Ambulatory Visit | Attending: Family Medicine | Admitting: Family Medicine

## 2024-01-19 DIAGNOSIS — Z1231 Encounter for screening mammogram for malignant neoplasm of breast: Secondary | ICD-10-CM | POA: Insufficient documentation

## 2024-03-15 DIAGNOSIS — N941 Unspecified dyspareunia: Secondary | ICD-10-CM | POA: Diagnosis not present

## 2024-03-15 DIAGNOSIS — R102 Pelvic and perineal pain: Secondary | ICD-10-CM | POA: Diagnosis not present

## 2024-03-15 DIAGNOSIS — D219 Benign neoplasm of connective and other soft tissue, unspecified: Secondary | ICD-10-CM | POA: Diagnosis not present

## 2024-03-15 DIAGNOSIS — N950 Postmenopausal bleeding: Secondary | ICD-10-CM | POA: Diagnosis not present

## 2024-05-13 DIAGNOSIS — D509 Iron deficiency anemia, unspecified: Secondary | ICD-10-CM | POA: Diagnosis not present

## 2024-05-13 DIAGNOSIS — N952 Postmenopausal atrophic vaginitis: Secondary | ICD-10-CM | POA: Diagnosis not present

## 2024-05-13 DIAGNOSIS — N941 Unspecified dyspareunia: Secondary | ICD-10-CM | POA: Diagnosis not present

## 2024-05-13 DIAGNOSIS — Z01419 Encounter for gynecological examination (general) (routine) without abnormal findings: Secondary | ICD-10-CM | POA: Diagnosis not present

## 2024-05-13 DIAGNOSIS — D259 Leiomyoma of uterus, unspecified: Secondary | ICD-10-CM | POA: Diagnosis not present

## 2024-05-23 DIAGNOSIS — D649 Anemia, unspecified: Secondary | ICD-10-CM | POA: Diagnosis not present

## 2024-06-23 DIAGNOSIS — Z0000 Encounter for general adult medical examination without abnormal findings: Secondary | ICD-10-CM | POA: Diagnosis not present

## 2024-06-23 DIAGNOSIS — Z6822 Body mass index (BMI) 22.0-22.9, adult: Secondary | ICD-10-CM | POA: Diagnosis not present

## 2024-06-29 DIAGNOSIS — N950 Postmenopausal bleeding: Secondary | ICD-10-CM | POA: Diagnosis not present

## 2024-06-29 DIAGNOSIS — D219 Benign neoplasm of connective and other soft tissue, unspecified: Secondary | ICD-10-CM | POA: Diagnosis not present

## 2024-07-06 ENCOUNTER — Encounter (HOSPITAL_COMMUNITY): Payer: Self-pay | Admitting: Obstetrics and Gynecology

## 2024-07-06 NOTE — Pre-Procedure Instructions (Signed)
 Surgical Instructions   Your procedure is scheduled on :  Tuesday,  07-12-2024. Report to [facility] Main Entrance A at 5:30  A.M., then check in with the Admitting office. Any questions or running late day of surgery: call [phone]  Questions prior to your surgery date: call [phone], Monday-Friday, 8am-4pm. If you experience any cold or flu symptoms such as cough, fever, chills, shortness of breath, etc. between now and your scheduled surgery, please notify your surgeon office.    Remember:  Do not eat any food and do not drink any liquids after midnight the night before your surgery.  This includes no water ,  candy,  gum,  and  mints.     Take these medicines the morning of surgery with A SIP OF WATER  :  none   May take these medicines IF NEEDED: none   One week prior to surgery, STOP taking any Aspirin (unless otherwise instructed by your surgeon) Aleve, Naproxen, Ibuprofen , Motrin , Advil , Goody's, BC's, all herbal medications, fish oil, and non-prescription vitamins.                     Do NOT Smoke (Tobacco/Vaping) and Do Not drink alcohol for 24 hours prior to your procedure.  If you use a CPAP at night, you may bring your mask/headgear for your overnight stay.   You will be asked to remove any contacts, glasses, piercing's, hearing aid's, dentures/partials prior to surgery. Please bring cases / contact solution/ etc. for these items day of surgery    Patients discharged the day of surgery will not be allowed to drive home, and someone needs to stay with them for 24 hours.  SURGICAL WAITING ROOM VISITATION Patients may have no more than 2 support people in the waiting area - these visitors may rotate.   Pre-op nurse will coordinate an appropriate time for 1 ADULT support person, who may not rotate, to accompany patient in pre-op.  Children under the age of 73 must have an adult with them who is not the patient and must remain in the main waiting area with an  adult.  If the patient needs to stay at the hospital during part of their recovery, the visitor guidelines for inpatient rooms apply.  Please refer to the [facility] website for the visitor guidelines for any additional information.   If you received a COVID test during your pre-op visit  it is requested that you wear a mask when out in public, stay away from anyone that may not be feeling well and notify your surgeon if you develop symptoms. If you have been in contact with anyone that has tested positive in the last 10 days please notify you surgeon.      Pre-operative CHG Bathing Instructions   You can play a key role in reducing the risk of infection after surgery. Your skin needs to be as free of germs as possible. You can reduce the number of germs on your skin by washing with CHG (chlorhexidine  gluconate) soap before surgery. CHG is an antiseptic soap that kills germs and continues to kill germs even after washing.   DO NOT use if you have an allergy to chlorhexidine /CHG or antibacterial soaps. If your skin becomes reddened or irritated, stop using the CHG and notify one of our RN day of surgery.              TAKE A SHOWER THE NIGHT BEFORE SURGERY AND THE DAY OF SURGERY    Please  keep in mind the following:  DO NOT shave, including legs and underarms, 48 hours prior to surgery.   You may shave your face before/day of surgery.  Place clean sheets on your bed the night before surgery Use a clean washcloth (not used since being washed) for each shower. DO NOT sleep with pet's night before surgery.  CHG Shower Instructions:  Wash your face and private area with normal soap. If you choose to wash your hair, wash first with your normal shampoo.  After you use shampoo/soap, rinse your hair and body thoroughly to remove shampoo/soap residue.  Turn the water  OFF and apply half the bottle of CHG soap to a CLEAN washcloth.  Apply CHG soap ONLY FROM YOUR NECK DOWN TO YOUR TOES (washing for  3-5 minutes)  DO NOT use CHG soap on face, private areas, open wounds, or sores.  Pay special attention to the area where your surgery is being performed.  If you are having back surgery, having someone wash your back for you may be helpful. Wait 2 minutes after CHG soap is applied, then you may rinse off the CHG soap.  Pat dry with a clean towel  Put on clean pajamas    Additional instructions for the day of surgery: DO NOT APPLY any lotions,  powder,  oils,  deodorants (may use underarm deodorant) , cologne/  perfumes  or makeup Do not wear jewelry / piercing's/ metal/  permanent jewelry must be removed prior to arrival day of surgery..  (No plastic piercing) Do not wear nail polish, gel polish, artificial nails, or any other type of covering on natural finger nails (toe nails are okay) Do not bring valuables to the hospital. [facility] is not responsible for valuables/personal belongings. Put on clean/comfortable clothes.  Please brush your teeth.  Ask your nurse before applying any prescription medications to the skin.

## 2024-07-06 NOTE — Progress Notes (Signed)
 Spoke w/ via phone for pre-op interview--- PT Lab needs dos----    NO     Lab results------ LAB APPT 07-07-2024 @ 0900 GETTING CBC/ T&S (PER ANES) COVID test -----patient states asymptomatic no test needed Arrive at ------- 0530 ON 07-12-2024 NPO after MN NO Solid Food.   Pre-Surgery Ensure or G2: N/A  Med rec completed Medications to take morning of surgery ----- NONE Diabetic medication ----- N/A  GLP1 agonist last dose: N/A GLP1 instructions:  Patient instructed no nail polish to be worn day of surgery Patient instructed to bring photo id and insurance card day of surgery Patient aware to have Driver (ride ) / caregiver    for 24 hours after surgery - HUSBAND, Angie Carroll Patient Special Instructions ----- WILL PICK UP SOAP AND WRITTEN INSTRUCTONS AT LAB APPT Pre-Op special Instructions ----- SENT INBOX MESSAGE TO DR ROSALVA IN [facility] ON 07-04-2024,  REQUESTED PRE-OP ORDERS  Patient verbalized understanding of instructions that were given at this phone interview. Patient denies chest pain, sob, fever, cough at the interview.

## 2024-07-07 ENCOUNTER — Encounter (HOSPITAL_COMMUNITY)
Admission: RE | Admit: 2024-07-07 | Discharge: 2024-07-07 | Disposition: A | Discharge status: Home / Self Care | Source: Ambulatory Visit | Attending: Obstetrics and Gynecology | Admitting: Obstetrics and Gynecology

## 2024-07-07 DIAGNOSIS — Z01818 Encounter for other preprocedural examination: Secondary | ICD-10-CM | POA: Diagnosis not present

## 2024-07-07 DIAGNOSIS — Z01812 Encounter for preprocedural laboratory examination: Secondary | ICD-10-CM | POA: Insufficient documentation

## 2024-07-07 LAB — CBC
HCT: 31.9 % — ABNORMAL LOW (ref 36.0–46.0)
Hemoglobin: 10.3 g/dL — ABNORMAL LOW (ref 12.0–15.0)
MCH: 27.5 pg (ref 26.0–34.0)
MCHC: 32.3 g/dL (ref 30.0–36.0)
MCV: 85.1 fL (ref 80.0–100.0)
Platelets: 271 K/uL (ref 150–400)
RBC: 3.75 MIL/uL — ABNORMAL LOW (ref 3.87–5.11)
RDW: 14.1 % (ref 11.5–15.5)
WBC: 5.5 K/uL (ref 4.0–10.5)
nRBC: 0 % (ref 0.0–0.2)

## 2024-07-07 LAB — TYPE AND SCREEN
ABO/RH(D): O POS
Antibody Screen: NEGATIVE

## 2024-07-11 ENCOUNTER — Other Ambulatory Visit: Payer: Self-pay | Admitting: Obstetrics and Gynecology
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
## 2024-07-12 ENCOUNTER — Observation Stay (HOSPITAL_COMMUNITY)
Admission: RE | Admit: 2024-07-12 | Discharge: 2024-07-13 | Disposition: A | Discharge status: Home / Self Care | Attending: Obstetrics and Gynecology | Admitting: Obstetrics and Gynecology

## 2024-07-12 ENCOUNTER — Ambulatory Visit (HOSPITAL_COMMUNITY): Admitting: Anesthesiology

## 2024-07-12 ENCOUNTER — Other Ambulatory Visit: Payer: Self-pay

## 2024-07-12 ENCOUNTER — Encounter (HOSPITAL_COMMUNITY): Payer: Self-pay | Admitting: Obstetrics and Gynecology

## 2024-07-12 ENCOUNTER — Encounter (HOSPITAL_COMMUNITY)
Admission: RE | Disposition: A | Discharge status: Home / Self Care | Payer: Self-pay | Source: Home / Self Care | Attending: Obstetrics and Gynecology

## 2024-07-12 DIAGNOSIS — N840 Polyp of corpus uteri: Secondary | ICD-10-CM | POA: Insufficient documentation

## 2024-07-12 DIAGNOSIS — N838 Other noninflammatory disorders of ovary, fallopian tube and broad ligament: Secondary | ICD-10-CM | POA: Diagnosis not present

## 2024-07-12 DIAGNOSIS — N72 Inflammatory disease of cervix uteri: Secondary | ICD-10-CM | POA: Diagnosis not present

## 2024-07-12 DIAGNOSIS — Z01818 Encounter for other preprocedural examination: Secondary | ICD-10-CM

## 2024-07-12 DIAGNOSIS — D259 Leiomyoma of uterus, unspecified: Secondary | ICD-10-CM | POA: Diagnosis not present

## 2024-07-12 DIAGNOSIS — Z90710 Acquired absence of both cervix and uterus: Secondary | ICD-10-CM | POA: Diagnosis present

## 2024-07-12 DIAGNOSIS — N950 Postmenopausal bleeding: Secondary | ICD-10-CM | POA: Diagnosis not present

## 2024-07-12 HISTORY — DX: Leiomyoma of uterus, unspecified: D25.9

## 2024-07-12 HISTORY — DX: Iron deficiency anemia, unspecified: D50.9

## 2024-07-12 HISTORY — PX: ROBOTIC ASSISTED TOTAL HYSTERECTOMY WITH BILATERAL SALPINGO OOPHERECTOMY: SHX6086

## 2024-07-12 HISTORY — DX: Unspecified dyspareunia: N94.10

## 2024-07-12 HISTORY — DX: Postmenopausal bleeding: N95.0

## 2024-07-12 HISTORY — DX: Presence of spectacles and contact lenses: Z97.3

## 2024-07-12 HISTORY — DX: Other corneal degeneration: H18.49

## 2024-07-12 HISTORY — DX: Anemia, unspecified: D64.9

## 2024-07-12 HISTORY — DX: Presence of dental prosthetic device (complete) (partial): Z97.2

## 2024-07-12 SURGERY — HYSTERECTOMY, TOTAL, ROBOT-ASSISTED, LAPAROSCOPIC, WITH BILATERAL SALPINGO-OOPHORECTOMY
Anesthesia: General | Laterality: Bilateral

## 2024-07-12 MED ORDER — GABAPENTIN 300 MG PO CAPS
300.0000 mg | ORAL_CAPSULE | ORAL | Status: AC
  Administered 2024-07-12: 300 mg via ORAL

## 2024-07-12 MED ORDER — DEXAMETHASONE SODIUM PHOSPHATE 10 MG/ML IJ SOLN
INTRAMUSCULAR | Status: AC
  Filled 2024-07-12: qty 1

## 2024-07-12 MED ORDER — BUPIVACAINE HCL (PF) 0.25 % IJ SOLN
INTRAMUSCULAR | Status: AC
Start: 2024-07-12 — End: 2024-07-12
  Filled 2024-07-12: qty 30

## 2024-07-12 MED ORDER — FENTANYL CITRATE (PF) 250 MCG/5ML IJ SOLN
INTRAMUSCULAR | Status: AC
  Filled 2024-07-12: qty 5

## 2024-07-12 MED ORDER — CHLORHEXIDINE GLUCONATE 0.12 % MT SOLN
OROMUCOSAL | Status: AC
  Filled 2024-07-12: qty 15

## 2024-07-12 MED ORDER — DROPERIDOL 2.5 MG/ML IJ SOLN: 0.6250 mg | Freq: Once | INTRAMUSCULAR | Status: DC | PRN

## 2024-07-12 MED ORDER — OXYCODONE HCL 5 MG PO TABS
5.0000 mg | ORAL_TABLET | ORAL | Status: DC | PRN
  Administered 2024-07-12 – 2024-07-13 (×4): 10 mg via ORAL
  Filled 2024-07-12 (×4): qty 2

## 2024-07-12 MED ORDER — ACETAMINOPHEN 10 MG/ML IV SOLN
INTRAVENOUS | Status: AC
  Filled 2024-07-12: qty 100

## 2024-07-12 MED ORDER — LACTATED RINGERS IV SOLN: INTRAVENOUS | Status: DC

## 2024-07-12 MED ORDER — SODIUM CHLORIDE 0.9 % IR SOLN
Status: DC | PRN
  Administered 2024-07-12: 1000 mL

## 2024-07-12 MED ORDER — CIPROFLOXACIN IN D5W 400 MG/200ML IV SOLN
400.0000 mg | INTRAVENOUS | Status: AC
  Administered 2024-07-12: 400 mg via INTRAVENOUS

## 2024-07-12 MED ORDER — SODIUM CHLORIDE (PF) 0.9 % IJ SOLN
INTRAMUSCULAR | Status: DC | PRN
  Administered 2024-07-12: 50 mL
  Administered 2024-07-12: 10 mL

## 2024-07-12 MED ORDER — KETOROLAC TROMETHAMINE 30 MG/ML IJ SOLN
30.0000 mg | Freq: Four times a day (QID) | INTRAMUSCULAR | Status: DC
  Administered 2024-07-12 – 2024-07-13 (×3): 30 mg via INTRAVENOUS
  Filled 2024-07-12 (×3): qty 1

## 2024-07-12 MED ORDER — PHENYLEPHRINE 80 MCG/ML (10ML) SYRINGE FOR IV PUSH (FOR BLOOD PRESSURE SUPPORT)
PREFILLED_SYRINGE | INTRAVENOUS | Status: AC
  Filled 2024-07-12: qty 10

## 2024-07-12 MED ORDER — ACETAMINOPHEN 500 MG PO TABS
ORAL_TABLET | ORAL | 0 refills | Status: AC
Start: 2024-07-12 — End: ?

## 2024-07-12 MED ORDER — PROPOFOL 500 MG/50ML IV EMUL
INTRAVENOUS | Status: DC | PRN
  Administered 2024-07-12: 25 ug/kg/min via INTRAVENOUS

## 2024-07-12 MED ORDER — ONDANSETRON HCL 4 MG/2ML IJ SOLN
INTRAMUSCULAR | Status: AC
Start: 2024-07-12 — End: 2024-07-12
  Filled 2024-07-12: qty 2

## 2024-07-12 MED ORDER — SIMETHICONE 80 MG PO CHEW
80.0000 mg | CHEWABLE_TABLET | Freq: Four times a day (QID) | ORAL | Status: DC | PRN
Start: 2024-07-12 — End: 2024-07-13

## 2024-07-12 MED ORDER — METHYLENE BLUE (ANTIDOTE) 1 % IV SOLN
INTRAVENOUS | Status: DC | PRN
  Administered 2024-07-12: 1 mL

## 2024-07-12 MED ORDER — ACETAMINOPHEN 500 MG PO TABS
1000.0000 mg | ORAL_TABLET | Freq: Four times a day (QID) | ORAL | Status: DC
  Administered 2024-07-12 – 2024-07-13 (×3): 1000 mg via ORAL
  Filled 2024-07-12 (×3): qty 2

## 2024-07-12 MED ORDER — ONDANSETRON HCL 4 MG PO TABS
4.0000 mg | ORAL_TABLET | Freq: Four times a day (QID) | ORAL | Status: DC | PRN
  Filled 2024-07-12: qty 1

## 2024-07-12 MED ORDER — BUPIVACAINE LIPOSOME 1.3 % IJ SUSP
INTRAMUSCULAR | Status: DC | PRN
  Administered 2024-07-12: 50 mL

## 2024-07-12 MED ORDER — CLINDAMYCIN PHOSPHATE 900 MG/50ML IV SOLN
INTRAVENOUS | Status: AC
Start: 2024-07-12 — End: 2024-07-12
  Filled 2024-07-12: qty 50

## 2024-07-12 MED ORDER — KETOROLAC TROMETHAMINE 30 MG/ML IJ SOLN
INTRAMUSCULAR | Status: AC
  Filled 2024-07-12: qty 1

## 2024-07-12 MED ORDER — POVIDONE-IODINE 10 % EX SWAB: 2.0000 | Freq: Once | CUTANEOUS | Status: DC

## 2024-07-12 MED ORDER — ROCURONIUM BROMIDE 10 MG/ML (PF) SYRINGE
PREFILLED_SYRINGE | INTRAVENOUS | Status: AC
  Filled 2024-07-12: qty 10

## 2024-07-12 MED ORDER — PANTOPRAZOLE SODIUM 40 MG PO TBEC
40.0000 mg | DELAYED_RELEASE_TABLET | Freq: Every day | ORAL | Status: DC
  Administered 2024-07-12 – 2024-07-13 (×2): 40 mg via ORAL
  Filled 2024-07-12 (×2): qty 1

## 2024-07-12 MED ORDER — LACTATED RINGERS IV SOLN
INTRAVENOUS | Status: DC
  Administered 2024-07-12: 1000 mL via INTRAVENOUS

## 2024-07-12 MED ORDER — METHYLENE BLUE (ANTIDOTE) 1 % IV SOLN
INTRAVENOUS | Status: AC
  Filled 2024-07-12: qty 10

## 2024-07-12 MED ORDER — CLINDAMYCIN PHOSPHATE 900 MG/50ML IV SOLN
900.0000 mg | INTRAVENOUS | Status: AC
  Administered 2024-07-12: 900 mg via INTRAVENOUS

## 2024-07-12 MED ORDER — SCOPOLAMINE 1 MG/3DAYS TD PT72: 1.0000 | MEDICATED_PATCH | TRANSDERMAL | Status: DC

## 2024-07-12 MED ORDER — MIDAZOLAM HCL 2 MG/2ML IJ SOLN
INTRAMUSCULAR | Status: DC | PRN
  Administered 2024-07-12: 2 mg via INTRAVENOUS

## 2024-07-12 MED ORDER — SUGAMMADEX SODIUM 200 MG/2ML IV SOLN
INTRAVENOUS | Status: DC | PRN
  Administered 2024-07-12: 300 mg via INTRAVENOUS

## 2024-07-12 MED ORDER — CHLORHEXIDINE GLUCONATE 0.12 % MT SOLN
15.0000 mL | Freq: Once | OROMUCOSAL | Status: AC
  Administered 2024-07-12: 15 mL via OROMUCOSAL

## 2024-07-12 MED ORDER — 0.9 % SODIUM CHLORIDE (POUR BTL) OPTIME
TOPICAL | Status: DC | PRN
  Administered 2024-07-12: 1000 mL

## 2024-07-12 MED ORDER — ACETAMINOPHEN 500 MG PO TABS
ORAL_TABLET | ORAL | Status: AC
  Filled 2024-07-12: qty 2

## 2024-07-12 MED ORDER — ACETAMINOPHEN 10 MG/ML IV SOLN
1000.0000 mg | Freq: Once | INTRAVENOUS | Status: AC
  Administered 2024-07-12: 1000 mg via INTRAVENOUS

## 2024-07-12 MED ORDER — SENNA 8.6 MG PO TABS
1.0000 | ORAL_TABLET | Freq: Two times a day (BID) | ORAL | Status: DC
  Administered 2024-07-12 – 2024-07-13 (×2): 8.6 mg via ORAL
  Filled 2024-07-12 (×2): qty 1

## 2024-07-12 MED ORDER — ROPIVACAINE HCL 5 MG/ML IJ SOLN
INTRAMUSCULAR | Status: AC
Start: 2024-07-12 — End: 2024-07-12
  Filled 2024-07-12: qty 30

## 2024-07-12 MED ORDER — PROPOFOL 10 MG/ML IV BOLUS
INTRAVENOUS | Status: AC
  Filled 2024-07-12: qty 20

## 2024-07-12 MED ORDER — EPHEDRINE 5 MG/ML INJ
INTRAVENOUS | Status: AC
Start: 2024-07-12 — End: 2024-07-12
  Filled 2024-07-12: qty 5

## 2024-07-12 MED ORDER — LIDOCAINE 2% (20 MG/ML) 5 ML SYRINGE
INTRAMUSCULAR | Status: AC
  Filled 2024-07-12: qty 5

## 2024-07-12 MED ORDER — HEMOSTATIC AGENTS (NO CHARGE) OPTIME
TOPICAL | Status: DC | PRN
  Administered 2024-07-12: 1 via TOPICAL

## 2024-07-12 MED ORDER — MIDAZOLAM HCL 2 MG/2ML IJ SOLN
INTRAMUSCULAR | Status: AC
Start: 2024-07-12 — End: 2024-07-12
  Filled 2024-07-12: qty 2

## 2024-07-12 MED ORDER — ONDANSETRON HCL 4 MG/2ML IJ SOLN
INTRAMUSCULAR | Status: DC | PRN
  Administered 2024-07-12: 4 mg via INTRAVENOUS

## 2024-07-12 MED ORDER — FENTANYL CITRATE (PF) 100 MCG/2ML IJ SOLN
25.0000 ug | INTRAMUSCULAR | Status: DC | PRN
  Administered 2024-07-12: 50 ug via INTRAVENOUS
  Administered 2024-07-12: 25 ug via INTRAVENOUS

## 2024-07-12 MED ORDER — ACETAMINOPHEN 500 MG PO TABS
1000.0000 mg | ORAL_TABLET | ORAL | Status: AC
  Administered 2024-07-12: 1000 mg via ORAL

## 2024-07-12 MED ORDER — KETOROLAC TROMETHAMINE 30 MG/ML IJ SOLN
INTRAMUSCULAR | Status: DC | PRN
  Administered 2024-07-12: 30 mg via INTRAVENOUS

## 2024-07-12 MED ORDER — FENTANYL CITRATE (PF) 250 MCG/5ML IJ SOLN
INTRAMUSCULAR | Status: DC | PRN
  Administered 2024-07-12 (×3): 50 ug via INTRAVENOUS
  Administered 2024-07-12: 100 ug via INTRAVENOUS

## 2024-07-12 MED ORDER — GABAPENTIN 300 MG PO CAPS
ORAL_CAPSULE | ORAL | Status: AC
  Filled 2024-07-12: qty 1

## 2024-07-12 MED ORDER — CIPROFLOXACIN IN D5W 400 MG/200ML IV SOLN
INTRAVENOUS | Status: AC
  Filled 2024-07-12: qty 200

## 2024-07-12 MED ORDER — LIDOCAINE 2% (20 MG/ML) 5 ML SYRINGE
INTRAMUSCULAR | Status: DC | PRN
  Administered 2024-07-12: 60 mg via INTRAVENOUS

## 2024-07-12 MED ORDER — PHENYLEPHRINE HCL-NACL 20-0.9 MG/250ML-% IV SOLN
INTRAVENOUS | Status: DC | PRN
  Administered 2024-07-12: 15 ug/min via INTRAVENOUS

## 2024-07-12 MED ORDER — STERILE WATER FOR IRRIGATION IR SOLN
Status: DC | PRN
  Administered 2024-07-12: 1000 mL

## 2024-07-12 MED ORDER — OXYCODONE HCL 5 MG PO TABS: 5.0000 mg | ORAL_TABLET | Freq: Once | ORAL | Status: DC | PRN

## 2024-07-12 MED ORDER — ALUM & MAG HYDROXIDE-SIMETH 200-200-20 MG/5ML PO SUSP: 30.0000 mL | ORAL | Status: DC | PRN

## 2024-07-12 MED ORDER — ALBUMIN HUMAN 5 % IV SOLN: INTRAVENOUS | Status: DC | PRN

## 2024-07-12 MED ORDER — PROPOFOL 10 MG/ML IV BOLUS
INTRAVENOUS | Status: DC | PRN
  Administered 2024-07-12: 150 mg via INTRAVENOUS

## 2024-07-12 MED ORDER — SODIUM CHLORIDE (PF) 0.9 % IJ SOLN
INTRAMUSCULAR | Status: AC
  Filled 2024-07-12: qty 50

## 2024-07-12 MED ORDER — ORAL CARE MOUTH RINSE: 15.0000 mL | Freq: Once | OROMUCOSAL | Status: AC

## 2024-07-12 MED ORDER — BUPIVACAINE LIPOSOME 1.3 % IJ SUSP
INTRAMUSCULAR | Status: AC
  Filled 2024-07-12: qty 20

## 2024-07-12 MED ORDER — SUCCINYLCHOLINE CHLORIDE 200 MG/10ML IV SOSY
PREFILLED_SYRINGE | INTRAVENOUS | Status: AC
  Filled 2024-07-12: qty 10

## 2024-07-12 MED ORDER — FENTANYL CITRATE (PF) 100 MCG/2ML IJ SOLN
INTRAMUSCULAR | Status: AC
  Filled 2024-07-12: qty 2

## 2024-07-12 MED ORDER — DEXAMETHASONE SODIUM PHOSPHATE 10 MG/ML IJ SOLN
INTRAMUSCULAR | Status: DC | PRN
  Administered 2024-07-12: 10 mg via INTRAVENOUS

## 2024-07-12 MED ORDER — OXYCODONE HCL 5 MG PO TABS: 5.0000 mg | ORAL_TABLET | ORAL | 0 refills | Status: AC | PRN

## 2024-07-12 MED ORDER — OXYCODONE HCL 5 MG/5ML PO SOLN: 5.0000 mg | Freq: Once | ORAL | Status: DC | PRN

## 2024-07-12 MED ORDER — IBUPROFEN 800 MG PO TABS: ORAL_TABLET | ORAL | 1 refills | Status: AC

## 2024-07-12 MED ORDER — IBUPROFEN 600 MG PO TABS: 600.0000 mg | ORAL_TABLET | Freq: Four times a day (QID) | ORAL | Status: DC

## 2024-07-12 MED ORDER — WHITE PETROLATUM EX OINT
TOPICAL_OINTMENT | CUTANEOUS | Status: AC
  Filled 2024-07-12: qty 28.35

## 2024-07-12 MED ORDER — ONDANSETRON HCL 4 MG/2ML IJ SOLN: 4.0000 mg | Freq: Four times a day (QID) | INTRAMUSCULAR | Status: DC | PRN

## 2024-07-12 MED ORDER — ROCURONIUM BROMIDE 10 MG/ML (PF) SYRINGE
PREFILLED_SYRINGE | INTRAVENOUS | Status: DC | PRN
  Administered 2024-07-12: 50 mg via INTRAVENOUS
  Administered 2024-07-12: 20 mg via INTRAVENOUS
  Administered 2024-07-12: 50 mg via INTRAVENOUS

## 2024-07-12 MED ORDER — MENTHOL 3 MG MT LOZG: 1.0000 | LOZENGE | OROMUCOSAL | Status: DC | PRN

## 2024-07-12 SURGICAL SUPPLY — 67 items
APPLICATOR ARISTA FLEXITIP XL (MISCELLANEOUS) IMPLANT
BARRIER ADHS 3X4 INTERCEED (GAUZE/BANDAGES/DRESSINGS) IMPLANT
CANNULA CAP OBTURATR AIRSEAL 8 (CAP) ×2 IMPLANT
CAUTERY HOOK MNPLR 1.6 DVNC XI (INSTRUMENTS) IMPLANT
CELLS DAT CNTRL 66122 CELL SVR (MISCELLANEOUS) IMPLANT
COVER BACK TABLE 60X90IN (DRAPES) ×2 IMPLANT
COVER TIP SHEARS 8 DVNC (MISCELLANEOUS) ×2 IMPLANT
DEFOGGER SCOPE WARM SEASHARP (MISCELLANEOUS) ×2 IMPLANT
DERMABOND ADVANCED .7 DNX12 (GAUZE/BANDAGES/DRESSINGS) ×2 IMPLANT
DILATOR CANAL MILEX (MISCELLANEOUS) ×2 IMPLANT
DRAPE ARM DVNC X/XI (DISPOSABLE) ×8 IMPLANT
DRAPE COLUMN DVNC XI (DISPOSABLE) ×2 IMPLANT
DRAPE SURG IRRIG POUCH 19X23 (DRAPES) ×2 IMPLANT
DRAPE UTILITY 15X26 TOWEL STRL (DRAPES) ×2 IMPLANT
DRIVER NDL MEGA 8 DVNC XI (INSTRUMENTS) ×2 IMPLANT
DRIVER NDLE MEGA DVNC XI (INSTRUMENTS) ×1 IMPLANT
DRSG TEGADERM 4X4.75 (GAUZE/BANDAGES/DRESSINGS) IMPLANT
DURAPREP 26ML APPLICATOR (WOUND CARE) ×2 IMPLANT
ELECTRODE REM PT RTRN 9FT ADLT (ELECTROSURGICAL) ×2 IMPLANT
FORCEPS BPLR LNG DVNC XI (INSTRUMENTS) ×2 IMPLANT
FORCEPS PROGRASP DVNC XI (FORCEP) IMPLANT
FORCEPS TENACULUM DVNC XI (FORCEP) IMPLANT
GAUZE 4X4 16PLY ~~LOC~~+RFID DBL (SPONGE) IMPLANT
GLOVE BIOGEL M 6.5 STRL (GLOVE) ×6 IMPLANT
GLOVE BIOGEL PI IND STRL 6.5 (GLOVE) ×6 IMPLANT
GLOVE BIOGEL PI IND STRL 7.0 (GLOVE) IMPLANT
GLOVE NEODERM STER SZ 7 (GLOVE) IMPLANT
GRASPER COBRA DVNC RU (INSTRUMENTS) ×2 IMPLANT
HEMOSTAT ARISTA ABSORB 3G PWDR (HEMOSTASIS) IMPLANT
HIBICLENS CHG 4% 4OZ BTL (MISCELLANEOUS) ×4 IMPLANT
IRRIGATION STRYKERFLOW (MISCELLANEOUS) ×2 IMPLANT
IV NS 1000ML BAXH (IV SOLUTION) IMPLANT
KIT PINK PAD W/HEAD ARM REST (MISCELLANEOUS) ×2 IMPLANT
LEGGING LITHOTOMY PAIR STRL (DRAPES) ×2 IMPLANT
MANIPULATOR ADVINCU DEL 2.5 PL (MISCELLANEOUS) IMPLANT
MANIPULATOR ADVINCU DEL 3.0 PL (MISCELLANEOUS) IMPLANT
MANIPULATOR ADVINCU DEL 3.5 PL (MISCELLANEOUS) IMPLANT
MANIPULATOR ADVINCU DEL 4.0 PL (MISCELLANEOUS) IMPLANT
NDL SPNL 22GX3.5 QUINCKE BK (NEEDLE) IMPLANT
NEEDLE SPNL 22GX3.5 QUINCKE BK (NEEDLE) ×1 IMPLANT
NS IRRIG 1000ML POUR BTL (IV SOLUTION) IMPLANT
OBTURATOR OPTICALSTD 8 DVNC (TROCAR) ×2 IMPLANT
OCCLUDER COLPOPNEUMO (BALLOONS) IMPLANT
PACK ROBOT WH (CUSTOM PROCEDURE TRAY) ×2 IMPLANT
PACK ROBOTIC GOWN (GOWN DISPOSABLE) ×2 IMPLANT
PAD OB MATERNITY 11 LF (PERSONAL CARE ITEMS) ×2 IMPLANT
POWDER SURGICEL 3.0 GRAM (HEMOSTASIS) IMPLANT
RETRACTOR WND ALEXIS 18 MED (MISCELLANEOUS) IMPLANT
SCISSORS LAP 5X45 EPIX DISP (ENDOMECHANICALS) ×2 IMPLANT
SCISSORS MNPLR CVD DVNC XI (INSTRUMENTS) ×2 IMPLANT
SEAL UNIV 5-12 XI (MISCELLANEOUS) ×4 IMPLANT
SEALER VESSEL EXT DVNC XI (MISCELLANEOUS) IMPLANT
SET IRRIG Y TYPE TUR BLADDER L (SET/KITS/TRAYS/PACK) IMPLANT
SET TRI-LUMEN FLTR TB AIRSEAL (TUBING) IMPLANT
SET TUBE FILTERED XL AIRSEAL (SET/KITS/TRAYS/PACK) ×2 IMPLANT
SPIKE FLUID TRANSFER (MISCELLANEOUS) ×4 IMPLANT
SUT VIC AB 0 CT1 27XBRD ANBCTR (SUTURE) ×4 IMPLANT
SUT VICRYL 0 UR6 27IN ABS (SUTURE) IMPLANT
SUT VICRYL 3 0 CT 3 (SUTURE) IMPLANT
SUT VICRYL RAPIDE 4/0 PS 2 (SUTURE) ×4 IMPLANT
SUT VLOC 180 0 9IN GS21 (SUTURE) ×2 IMPLANT
TIP ENDOSCOPIC SURGICEL (TIP) IMPLANT
TOWEL GREEN STERILE (TOWEL DISPOSABLE) ×2 IMPLANT
TRAY FOLEY W/BAG SLVR 14FR (SET/KITS/TRAYS/PACK) IMPLANT
TROCAR PORT AIRSEAL 8X120 (TROCAR) IMPLANT
UNDERPAD 30X36 HEAVY ABSORB (UNDERPADS AND DIAPERS) ×2 IMPLANT
WATER STERILE IRR 1000ML POUR (IV SOLUTION) ×2 IMPLANT

## 2024-07-12 NOTE — H&P (Signed)
 Date of Initial H&P: 07/11/2024  History reviewed, patient examined, no change in status, stable for surgery.

## 2024-07-12 NOTE — Transfer of Care (Signed)
 Immediate Anesthesia Transfer of Care Note  Patient: Angie Carroll  Procedure(s) Performed: HYSTERECTOMY, TOTAL, ROBOT-ASSISTED, LAPAROSCOPIC, WITH RIGHT SALPINGO-OOPHORECTOMY, REPAIR OF VAGINAL LACERATION (Bilateral)  Patient Location: PACU  Anesthesia Type:General  Level of Consciousness: drowsy, patient cooperative, and responds to stimulation  Airway & Oxygen Therapy: Patient Spontanous Breathing and Patient connected to face mask oxygen  Post-op Assessment: Report given to RN and Post -op Vital signs reviewed and stable  Post vital signs: Reviewed and stable  Last Vitals:  Vitals Value Taken Time  BP 122/79 07/12/24 12:24  Temp    Pulse 51 07/12/24 12:29  Resp 12 07/12/24 12:29  SpO2 100 % 07/12/24 12:29  Vitals shown include unfiled device data.  Last Pain:  Vitals:   07/12/24 0700  TempSrc:   PainSc: 4       Patients Stated Pain Goal: 5 (07/12/24 0606)  Complications: No notable events documented.

## 2024-07-12 NOTE — Anesthesia Preprocedure Evaluation (Signed)
 Anesthesia Evaluation  Patient identified by MRN, date of birth, ID band Patient awake    Reviewed: Allergy & Precautions, NPO status , Patient's Chart, lab work & pertinent test results  Airway Mallampati: II  TM Distance: >3 FB Neck ROM: Full    Dental  (+) Dental Advisory Given   Pulmonary neg pulmonary ROS   breath sounds clear to auscultation       Cardiovascular negative cardio ROS  Rhythm:Regular Rate:Normal     Neuro/Psych negative neurological ROS     GI/Hepatic negative GI ROS, Neg liver ROS,,,  Endo/Other  negative endocrine ROS    Renal/GU negative Renal ROS     Musculoskeletal   Abdominal   Peds  Hematology  (+) Blood dyscrasia, Sickle cell trait and anemia   Anesthesia Other Findings   Reproductive/Obstetrics                              Anesthesia Physical Anesthesia Plan  ASA: 2  Anesthesia Plan: General   Post-op Pain Management: Tylenol  PO (pre-op)*, Gabapentin  PO (pre-op)* and Toradol  IV (intra-op)*   Induction: Intravenous  PONV Risk Score and Plan: 4 or greater and Scopolamine  patch - Pre-op, Midazolam , Dexamethasone , Ondansetron  and Treatment may vary due to age or medical condition  Airway Management Planned: Oral ETT  Additional Equipment:   Intra-op Plan:   Post-operative Plan: Extubation in OR  Informed Consent: I have reviewed the patients History and Physical, chart, labs and discussed the procedure including the risks, benefits and alternatives for the proposed anesthesia with the patient or authorized representative who has indicated his/her understanding and acceptance.     Dental advisory given  Plan Discussed with: CRNA  Anesthesia Plan Comments:         Anesthesia Quick Evaluation

## 2024-07-12 NOTE — Anesthesia Procedure Notes (Signed)
 Procedure Name: Intubation Date/Time: 07/12/2024 7:46 AM  Performed by: Jolynn Mage, CRNAPre-anesthesia Checklist: Patient identified, Patient being monitored, Timeout performed, Emergency Drugs available and Suction available Patient Re-evaluated:Patient Re-evaluated prior to induction Oxygen Delivery Method: Circle System Utilized Preoxygenation: Pre-oxygenation with 100% oxygen Induction Type: IV induction Ventilation: Mask ventilation without difficulty Laryngoscope Size: Miller and 2 Grade View: Grade I Tube type: Oral Tube size: 7.0 mm Number of attempts: 1 Airway Equipment and Method: Stylet Placement Confirmation: ETT inserted through vocal cords under direct vision, positive ETCO2 and breath sounds checked- equal and bilateral Secured at: 21 cm Tube secured with: Tape Dental Injury: Teeth and Oropharynx as per pre-operative assessment

## 2024-07-12 NOTE — Progress Notes (Signed)
Family called and updated 

## 2024-07-12 NOTE — Op Note (Signed)
 07/12/2024  11:35 AM  PATIENT:  Angie Carroll  53 y.o. female  PRE-OPERATIVE DIAGNOSIS:  Fibroids  Postmenopausal bleeding  POST-OPERATIVE DIAGNOSIS:  Fibroids Postmenopausal bleeding  PROCEDURE:  Procedure(s): HYSTERECTOMY, TOTAL, ROBOT-ASSISTED, LAPAROSCOPIC, WITH RIGHT SALPINGO-OOPHORECTOMY, REPAIR OF VAGINAL LACERATION (Bilateral)  SURGEON:  Surgeons and Role:    DEWAINE Rosalva Sawyer, MD - Primary  PHYSICIAN ASSISTANT:   ASSISTANTS: Farrel Rattler RNFA An experienced assistant was required given the standard of surgical care given the complexity of the case.  This assistant was needed for exposure, dissection, suctioning, retraction, instrument exchange, assisting with delivery with administration of fundal pressure, and for overall help during the procedure.    ANESTHESIA:   general  EBL: 50 mL  BLOOD ADMINISTERED:none  DRAINS: Urinary Catheter (Foley)   LOCAL MEDICATIONS USED:  OTHER Ropivicaine/ exparel  with Marcaine   SPECIMEN:  Source of Specimen:  Uterus cervix and right fallopian tube and ovary .SABRA The specimen weight 599 grams.   DISPOSITION OF SPECIMEN:  PATHOLOGY  COUNTS:  YES  TOURNIQUET:  * No tourniquets in log *  DICTATION: .Note written in EPIC  PLAN OF CARE: Discharge to home after PACU  PATIENT DISPOSITION:  PACU - hemodynamically stable.   Delay start of Pharmacological VTE agent (>24hrs) due to surgical blood loss or risk of bleeding: not applicable  Findings: Enlarged Fibroid Uterus with irregular contour.  Normal appearing right fallopian tube and ovary. The left fallopian tube and ovary were surgically absent.   Procedure: The patient was taken to the operating room #6 [facility]  where she was placed under general anesthesia. Time out was performed. She was placed in dorsal lithotomy position and prepped and draped in the usual sterile fashion. A weighted speculum was placed into the vagina. A Deaver was placed anteriorly for retraction.  The anterior lip of the cervix was grasped with a single-tooth tenaculum. The vaginal mucosa was injected with 2.5 cc of ropivacaine  at the 2/4/ 8 and 10 o'clock positions. The uterus was sounded to 10 cm. the cervix was dilated to 6 mm . 0 vicryl suture placed at the 12 and 6:00 positions Of the cervix to facilitate placement of a uterine manipulator. The manipulator was placed without difficulty. Weighted speculum and Deaver were removed.   Attention was turned to the patient's abdomen where a 8 mm trocar was placed 2 cm above the umbilicus. under direct visualization . The pneumoperitoneum was achieved with PCO2 gas. The laparoscope was removed. 60 cc of ropivacaine  were injected into the abdominal cavity. The laparoscope was reinserted. An 8 mm trocar was placed in the right upper quadrant 16 centimeters from the umbilicus.later connected to robotic arm #4). An incision was made in the Right upper quadrant TROCAR WAS PLACED 8 cm from the umbilicus. Later connected to robotic arm #3. An 8 mm incision was made in the left upper quadrant 16 cm from the umbilicus and connected to robot arm #1. \ ( All incision sites were injected with 10cc of exparel  mixed with marcaine  prior to port placement.)   Once all ports had been placed under direct visualization.The laparoscope was removed and the [employer] robotic system was then docked. The robotic arms were connected to the corresponding trocars as listed above. The laparoscope was then reinserted. The long tip bipolar forceps were placed into port #1. The pro-grasp was placed in the port #4. A vessel sealer was placed in port #3. All instruments were directed into the pelvis under direct visualization.  Attention was turned to the surgeons console.. The left infundibulo-pelvic  ligament was cauterized and transected with the vessel sealer The broad ligament was cauterized and transected with the vessel sealer .The round ligament was cauterized and transected  with the vessel sealer  The anterior leaf of broad ligament was incised along the bladder reflection to the midline.  The right infundibulo-pelvic ligament was cauterized and transected with the vessel sealer. The right broad ligament was cauterized and transected with the vessel sealer. The right round ligament was cauterized and transected with the vessel sealer. The broad ligament was incised to the midline. The bladder was filled in a retrograde fashion with saline stained with methylene blue .  The bladder was dissected off the lower uterine segments of the cervix via sharp and blunt dissection.   The uterine arteries were skeleton bilaterally. They were  cauterized and transected with the vessel sealer The KOH ring was identified. The anterior colpotomy was performed followed by the posterior colpotomy. Once the uterus,cervix and bilateral fallopian tubes were completely excised an attempt was made to  remove the specimen through the vagina. Due to the size of the specimen it was necessary to core the specimen  to facilitate removal from the vagina.   The  bipolar forceps and scissors were removed and cobra forceps were placed in the port #1 and the mega needle driver was placed in to port #3.  The vaginal cuff was closed with running suture if 0 v-lock. The pelvis was irrigated.  Surgicel powder was placed along the vaginal cuff.  All pelvic pedicles were examined and hemostasis was noted.  All instruments removed from the ports. All ports were removed under direct Visualization. The pneumoperitoneum was released. The skin incisions were closed with 4-0 Vicryl and then covered with Derma bond.   The vagina was inspected. A small laceration at the introitus was noted this was re-approximated with 3-0 vicryl. Sponge lap and needle counts weIre correct x 2. The patient was awakened from anesthesia and taken to the recovery room in stable condition.

## 2024-07-13 ENCOUNTER — Encounter (HOSPITAL_COMMUNITY): Payer: Self-pay | Admitting: Obstetrics and Gynecology

## 2024-07-13 DIAGNOSIS — N72 Inflammatory disease of cervix uteri: Secondary | ICD-10-CM | POA: Diagnosis not present

## 2024-07-13 DIAGNOSIS — N950 Postmenopausal bleeding: Secondary | ICD-10-CM | POA: Diagnosis not present

## 2024-07-13 DIAGNOSIS — N838 Other noninflammatory disorders of ovary, fallopian tube and broad ligament: Secondary | ICD-10-CM | POA: Diagnosis not present

## 2024-07-13 DIAGNOSIS — D259 Leiomyoma of uterus, unspecified: Secondary | ICD-10-CM | POA: Diagnosis not present

## 2024-07-13 DIAGNOSIS — N840 Polyp of corpus uteri: Secondary | ICD-10-CM | POA: Diagnosis not present

## 2024-07-13 LAB — HEMOGLOBIN: Hemoglobin: 8.2 g/dL — ABNORMAL LOW (ref 12.0–15.0)

## 2024-07-13 NOTE — Discharge Summary (Signed)
 Physician Discharge Summary  Patient ID: Angie Carroll MRN: [id number] DOB/AGE: Jul 10, 1971 53 y.o.  Admit date: 07/12/2024 Discharge date: 07/13/2024  Admission Diagnoses:1) Uterine fibroids 2) postmenopausal bleeding  Discharge Diagnoses:  Principal Problem:   S/P laparoscopic hysterectomy   Discharged Condition: stable  Hospital Course: pt was admitted for observation after undergoing a robotic assisted laparoscopic hysterectomy with right salpingo-oophorectomy. She did well postoperative and is discharged home on pod #1 in stable condition. Prior to discharge she was ambulating , tolerating po, her pain was controlled with oral analgesics. She was voiding without difficulty.   Consults: None  Significant Diagnostic Studies: labs:  Results for orders placed or performed during the hospital encounter of 07/12/24 (from the past 24 hours)  Hemoglobin     Status: Abnormal   Collection Time: 07/13/24  4:38 AM  Result Value Ref Range   Hemoglobin 8.2 (L) 12.0 - 15.0 g/dL     Treatments: surgery: robotic assisted laparoscopic hysterectomy with right salpingo-oophorectomy   Discharge Exam: Blood pressure 121/65, pulse 65, temperature 98.3 F (36.8 C), temperature source Oral, resp. rate 16, height 5' 6 (1.676 m), weight 63.5 kg, SpO2 97%. General appearance: alert, cooperative, and no distress Resp: No distress  GI: Soft appropriately tender nondistended  Extremities: extremities normal, atraumatic, no cyanosis or edema Incision/Wound: Well approximated no erythema or exudate   Disposition: Discharge disposition: 01-Home or Self Care       Discharge Instructions     Call MD for:  persistant nausea and vomiting   Complete by: As directed    Call MD for:  redness, tenderness, or signs of infection (pain, swelling, redness, odor or green/yellow discharge around incision site)   Complete by: As directed    Call MD for:  severe uncontrolled pain   Complete by: As directed     Call MD for:  temperature >100.4   Complete by: As directed    Diet general   Complete by: As directed    Driving Restrictions   Complete by: As directed    Avoid driving for 1 week   Increase activity slowly   Complete by: As directed    Lifting restrictions   Complete by: As directed    Avoid lifting over 10 lbs for 6 weeks and until approved by Dr. Rosalva   May shower / Bathe   Complete by: As directed    May walk up steps   Complete by: As directed    No wound care   Complete by: As directed    Sexual Activity Restrictions   Complete by: As directed    Avoid sex for 6 weeks and until approved by Dr. Rosalva      Allergies as of 07/13/2024       Reactions   Cephalexin Rash        Medication List     TAKE these medications    acetaminophen  500 MG tablet Commonly known as: TYLENOL  2 tablets every 8 hours for the first 24 hours then every 8 hours as needed for mild pain   estradiol 0.1 MG/GM vaginal cream Commonly known as: ESTRACE Place 1 Applicatorful vaginally 2 (two) times a week. Bedtime   ferrous sulfate 324 MG Tbec Take 324 mg by mouth in the morning and at bedtime.   Flintstones Multivitamin Chew Chew 2 each by mouth daily.   ibuprofen  800 MG tablet Commonly known as: ADVIL  1 by mouth every 8 hours for the first 24 hours then every 8 hours  as needed for mild pain   oxyCODONE  5 MG immediate release tablet Commonly known as: Oxy IR/ROXICODONE  Take 1 tablet (5 mg total) by mouth every 4 (four) hours as needed for moderate pain (pain score 4-6).   TYLENOL  SINUS CONGESTION/PAIN PO Take by mouth as needed.        Follow-up Information     Rosalva Sawyer, MD. Go in 2 week(s).   Specialty: Obstetrics and Gynecology Contact information: [address] [place] [address] [phone]                 Signed: Sawyer Rosalva 07/13/2024, 10:50 AM

## 2024-07-13 NOTE — Anesthesia Postprocedure Evaluation (Signed)
 Anesthesia Post Note  Patient: Angie Carroll  Procedure(s) Performed: HYSTERECTOMY, TOTAL, ROBOT-ASSISTED, LAPAROSCOPIC, WITH RIGHT SALPINGO-OOPHORECTOMY, REPAIR OF VAGINAL LACERATION (Bilateral)     Patient location during evaluation: PACU Anesthesia Type: General Level of consciousness: awake and alert Pain management: pain level controlled Vital Signs Assessment: post-procedure vital signs reviewed and stable Respiratory status: spontaneous breathing, nonlabored ventilation, respiratory function stable and patient connected to nasal cannula oxygen Cardiovascular status: blood pressure returned to baseline and stable Postop Assessment: no apparent nausea or vomiting Anesthetic complications: no   No notable events documented.  Last Vitals:  Vitals:   07/13/24 0542 07/13/24 1223  BP: 121/65 124/73  Pulse: 65 66  Resp: 16 16  Temp:  36.9 C  SpO2: 97% 99%    Last Pain:  Vitals:   07/13/24 1223  TempSrc: Oral  PainSc:                  [name]

## 2024-07-15 LAB — SURGICAL PATHOLOGY

## 2024-09-28 DIAGNOSIS — N898 Other specified noninflammatory disorders of vagina: Secondary | ICD-10-CM | POA: Diagnosis not present
